# Patient Record
Sex: Female | Born: 1940 | Race: White | Hispanic: No | Marital: Married | Smoking: Former smoker
Health system: Southern US, Community
[De-identification: ages and names within clinical notes are randomized; demographics above are authoritative.]

## PROBLEM LIST (undated history)

## (undated) DIAGNOSIS — M1990 Unspecified osteoarthritis, unspecified site: Secondary | ICD-10-CM

## (undated) HISTORY — PX: JOINT REPLACEMENT: SHX530

## (undated) HISTORY — PX: OTHER SURGICAL HISTORY: SHX169

## (undated) HISTORY — PX: HERNIA REPAIR: SHX51

## (undated) HISTORY — PX: KNEE SURGERY: SHX244

---

## 2005-02-09 ENCOUNTER — Ambulatory Visit: Payer: Self-pay | Admitting: Internal Medicine

## 2005-06-08 ENCOUNTER — Ambulatory Visit: Payer: Self-pay | Admitting: Obstetrics and Gynecology

## 2005-06-14 ENCOUNTER — Inpatient Hospital Stay: Payer: Self-pay | Admitting: Obstetrics and Gynecology

## 2006-03-17 ENCOUNTER — Ambulatory Visit: Payer: Self-pay | Admitting: Internal Medicine

## 2006-09-29 IMAGING — CR DG ABDOMEN 3V
1 series · 5 of 5 positions shown · non-contrast
Comparison: none

REASON FOR EXAM: Rule out bowel obstruction
COMMENTS:  Bedside (portable):Y

[Series 1: view not recorded · 0.17mm/px · 5 of 5 slices shown]
[im 1/5]
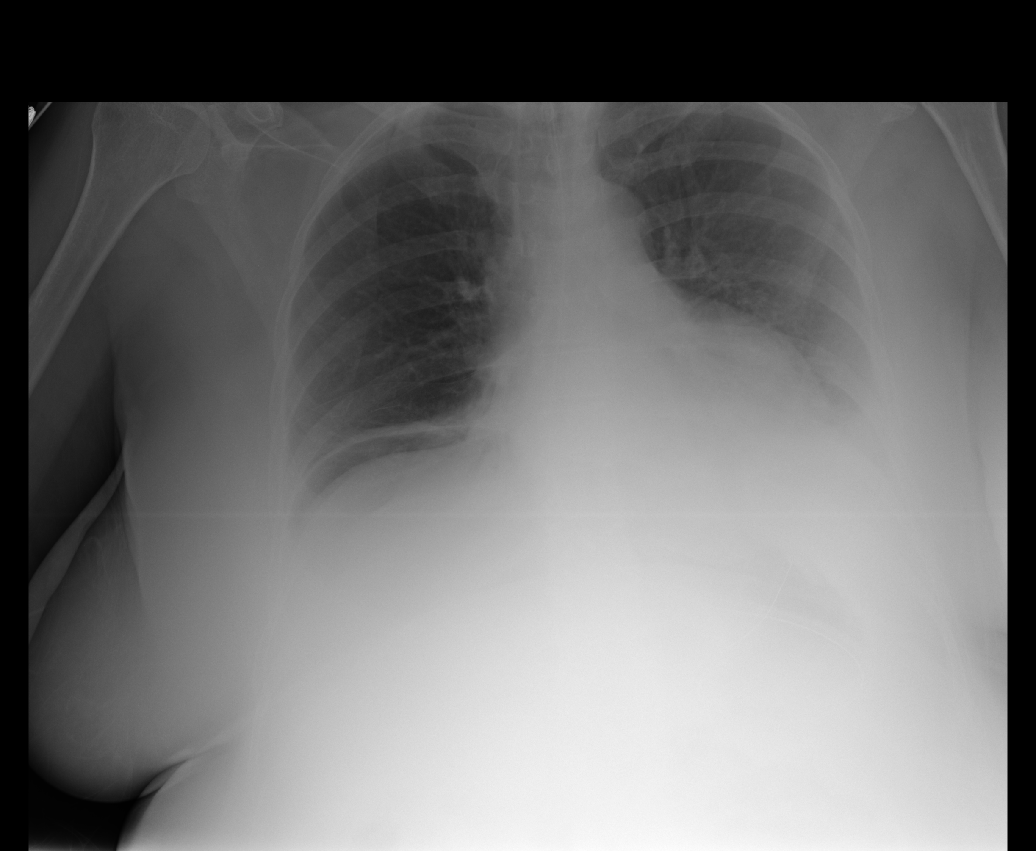
[im 2/5]
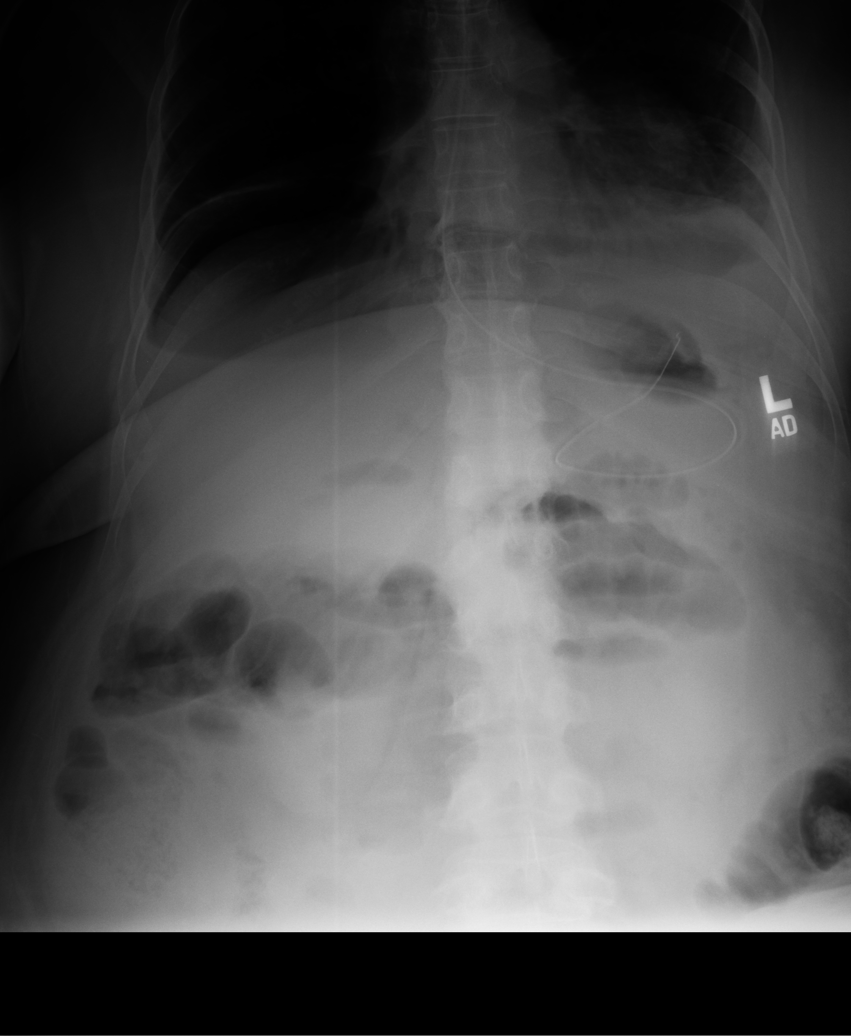
[im 3/5]
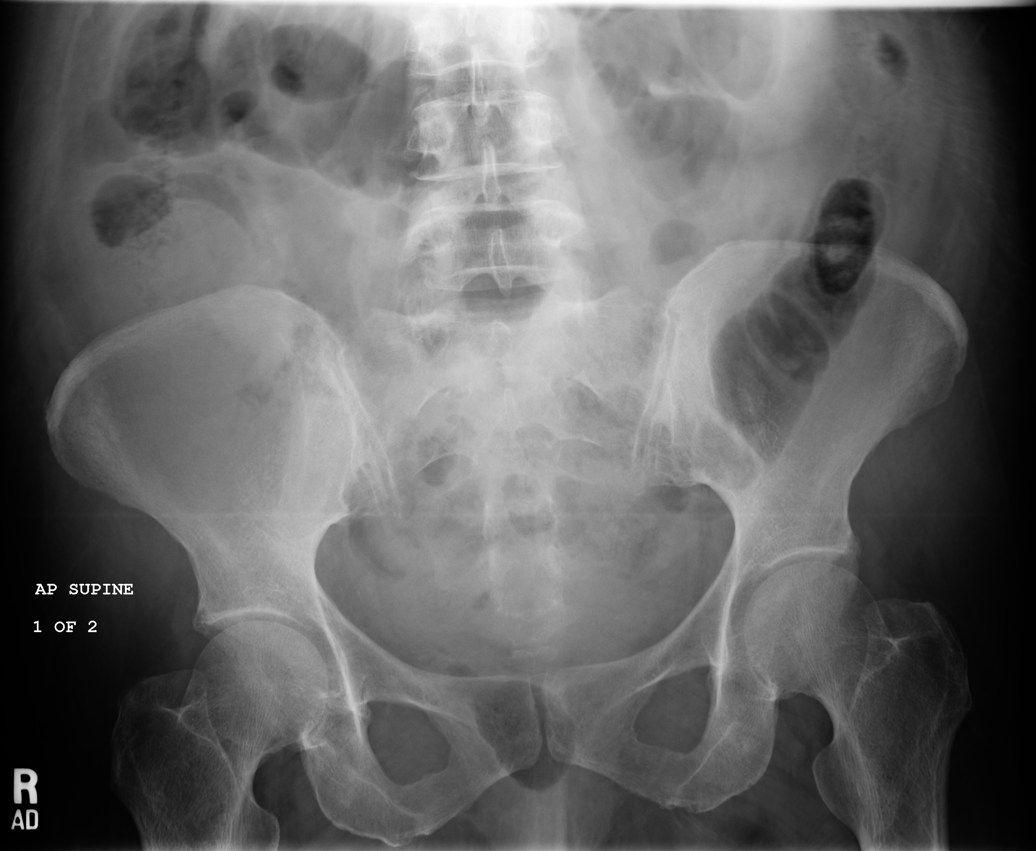
[im 4/5]
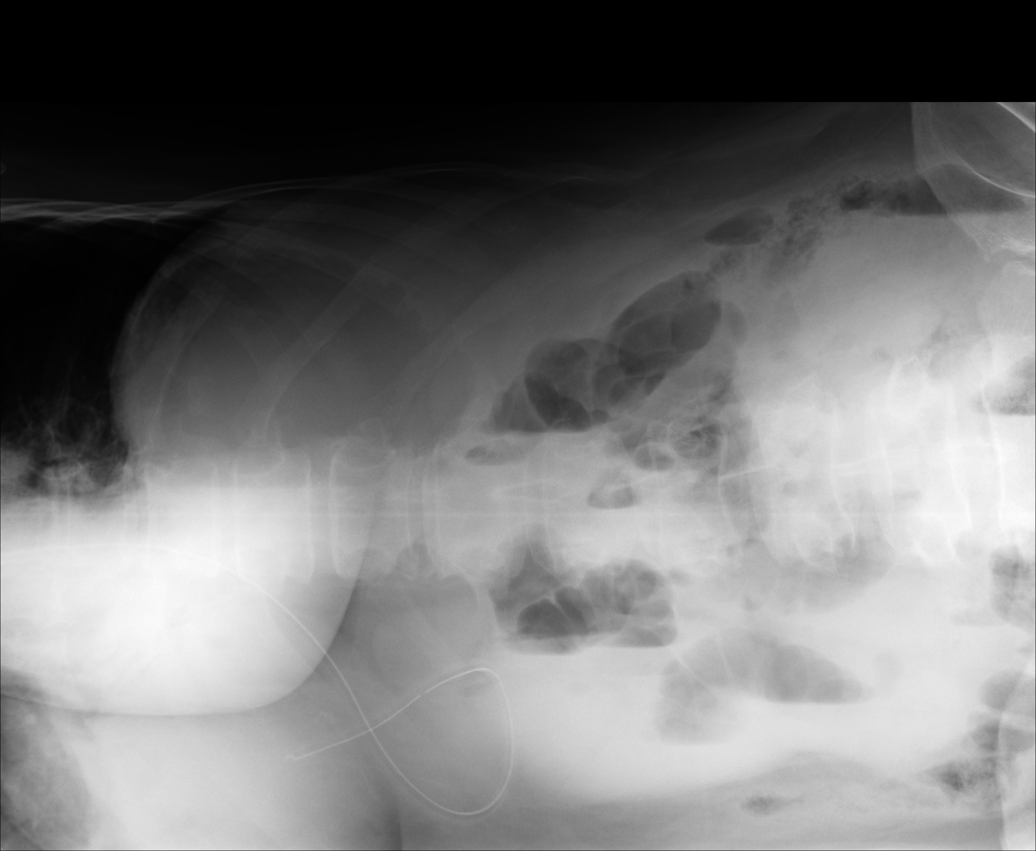
[im 5/5]
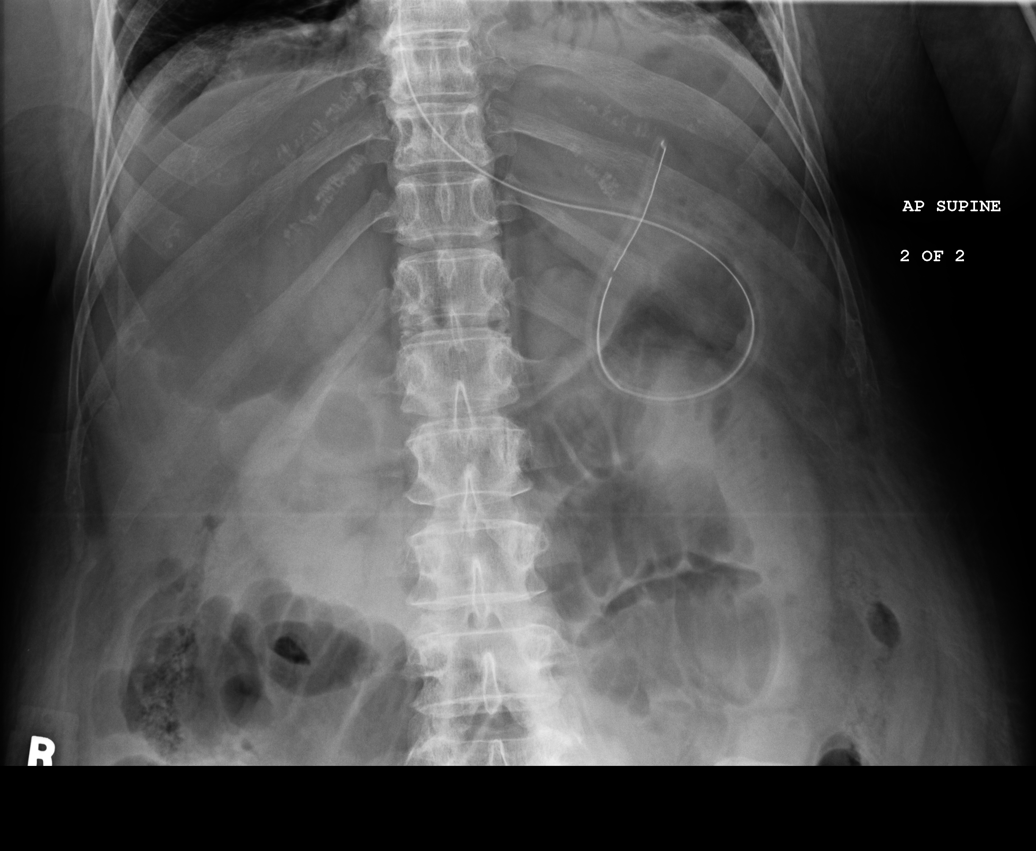

[5 of 5 positions shown; findings below may reference images not displayed]

PROCEDURE:     DXR - DXR ABDOMEN 3-WAY (INCL PA CXR)  - June 17, 2005  [DATE]

RESULT:        PA view of the chest shows subdiaphragmatic free air, similar
to that observed on the prior chest radiograph of yesterday.  The current
exam shows increased density at the LEFT base compatible with pneumonia and
atelectasis.

Multiple views of the abdomen were obtained.  In the erect view,
subdiaphragmatic air is observed.  A nasogastric tube is present with the
distal portion in the stomach.  Scattered nonspecific air-fluid levels are
noted in multiple loops of small bowel. Gas is also visualized in the colon.
 No acute bony abnormalities are seen.
IMPRESSION: 1.     Persistent subdiaphragmatic free air which on the chest radiograph
appears essentially unchanged in degree as compared to the exam of yesterday.
2.     There are nonspecific scattered air-fluid levels in multiple loops of
small bowel.  No findings of bowel obstruction are seen.
3.     There is increased density at the LEFT lung base compatible with
pneumonia and/or atelectasis.

## 2006-09-30 IMAGING — CR DG CHEST 1V PORT
1 series · 1 of 1 positions shown · non-contrast
Comparison: none

REASON FOR EXAM: Ventilator
COMMENTS:

[view not recorded]
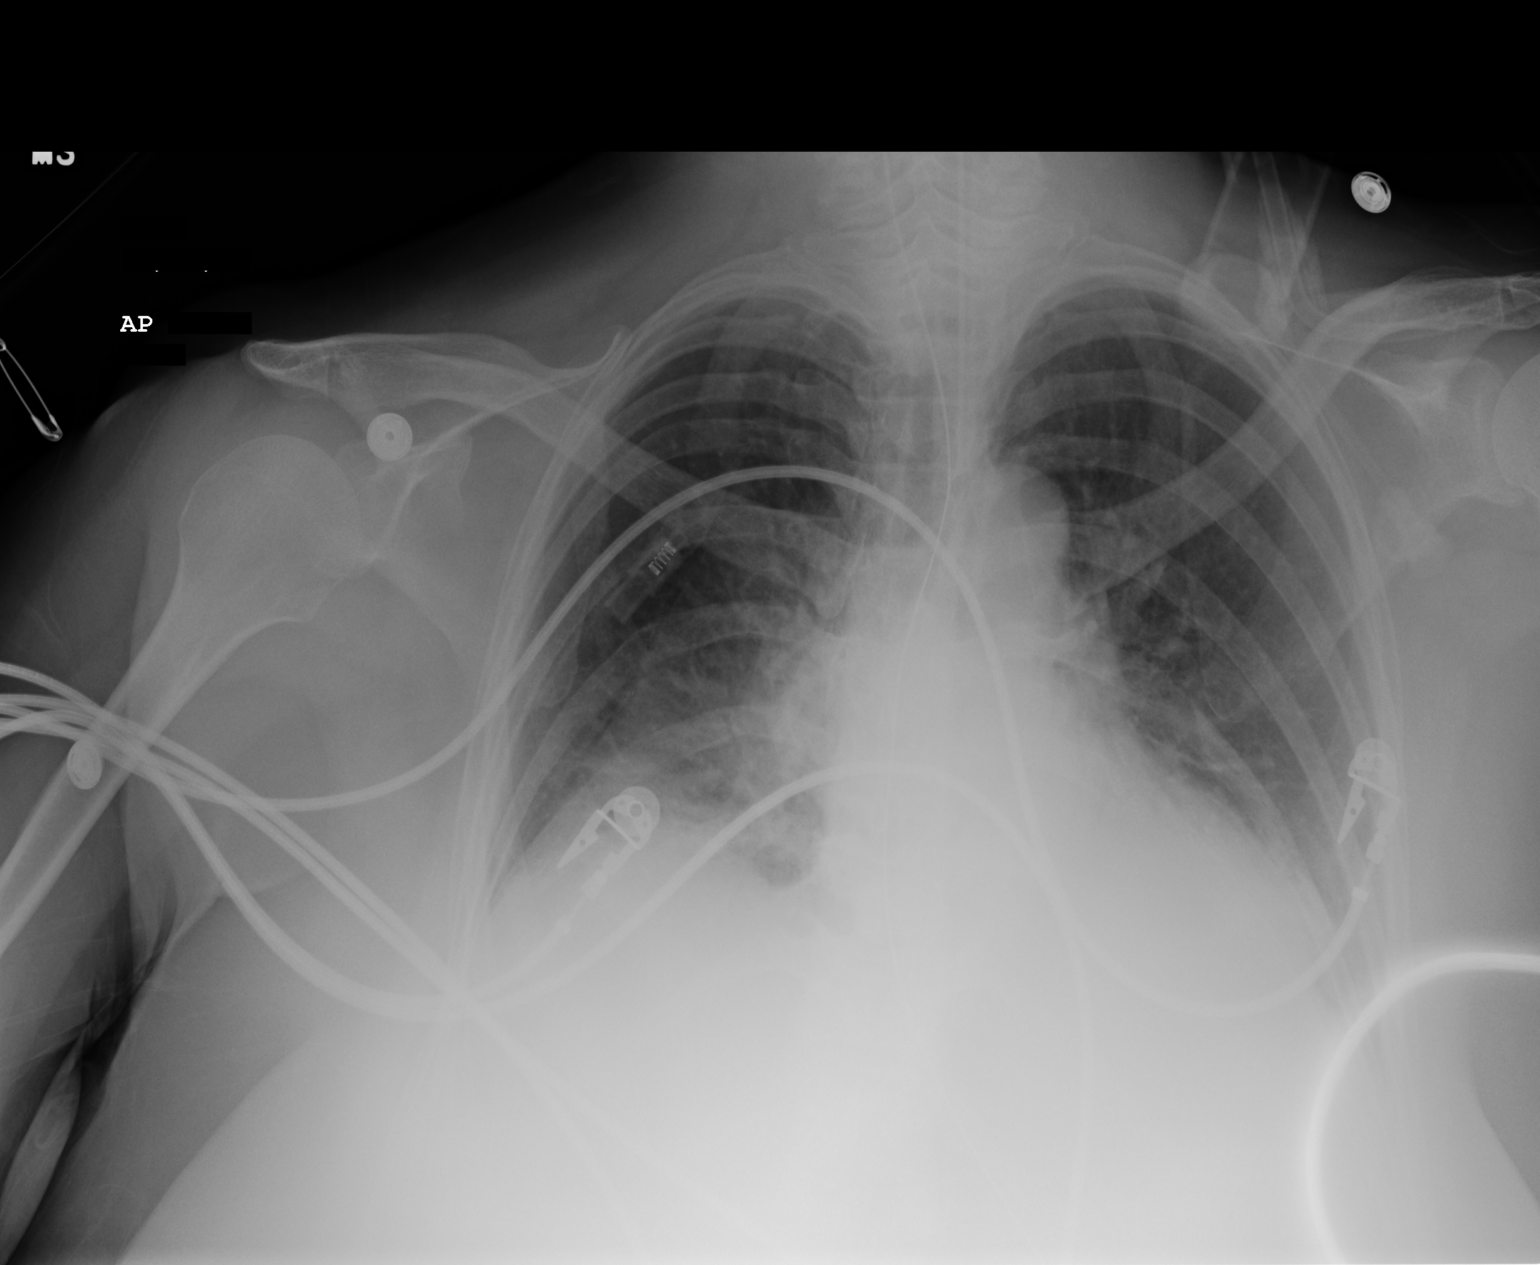

[1 of 1 positions shown; findings below may reference images not displayed]

PROCEDURE:     DXR - DXR PORTABLE CHEST SINGLE VIEW  - June 18, 2005  [DATE]

RESULT:     Comparison is made to a study [DATE].

The lungs are reasonably well inflated. The hemidiaphragms remain partially
obscured but some improvement on the RIGHT especially has occurred. The
perihilar interstitial markings remain increased and the cardiopericardial
silhouette remains enlarged. The endotracheal tube tip lies approximately
1.5 cm above the clavicular heads. Advancement by 1.0 cm may be of value.
IMPRESSION: There may have been slight interval improvement since
yesterday's study. Continued follow-up films are recommended.

## 2006-10-01 IMAGING — CR DG CHEST 1V PORT
1 series · 1 of 1 positions shown · non-contrast
Comparison: none

REASON FOR EXAM: Ventilator
COMMENTS:

[view not recorded]
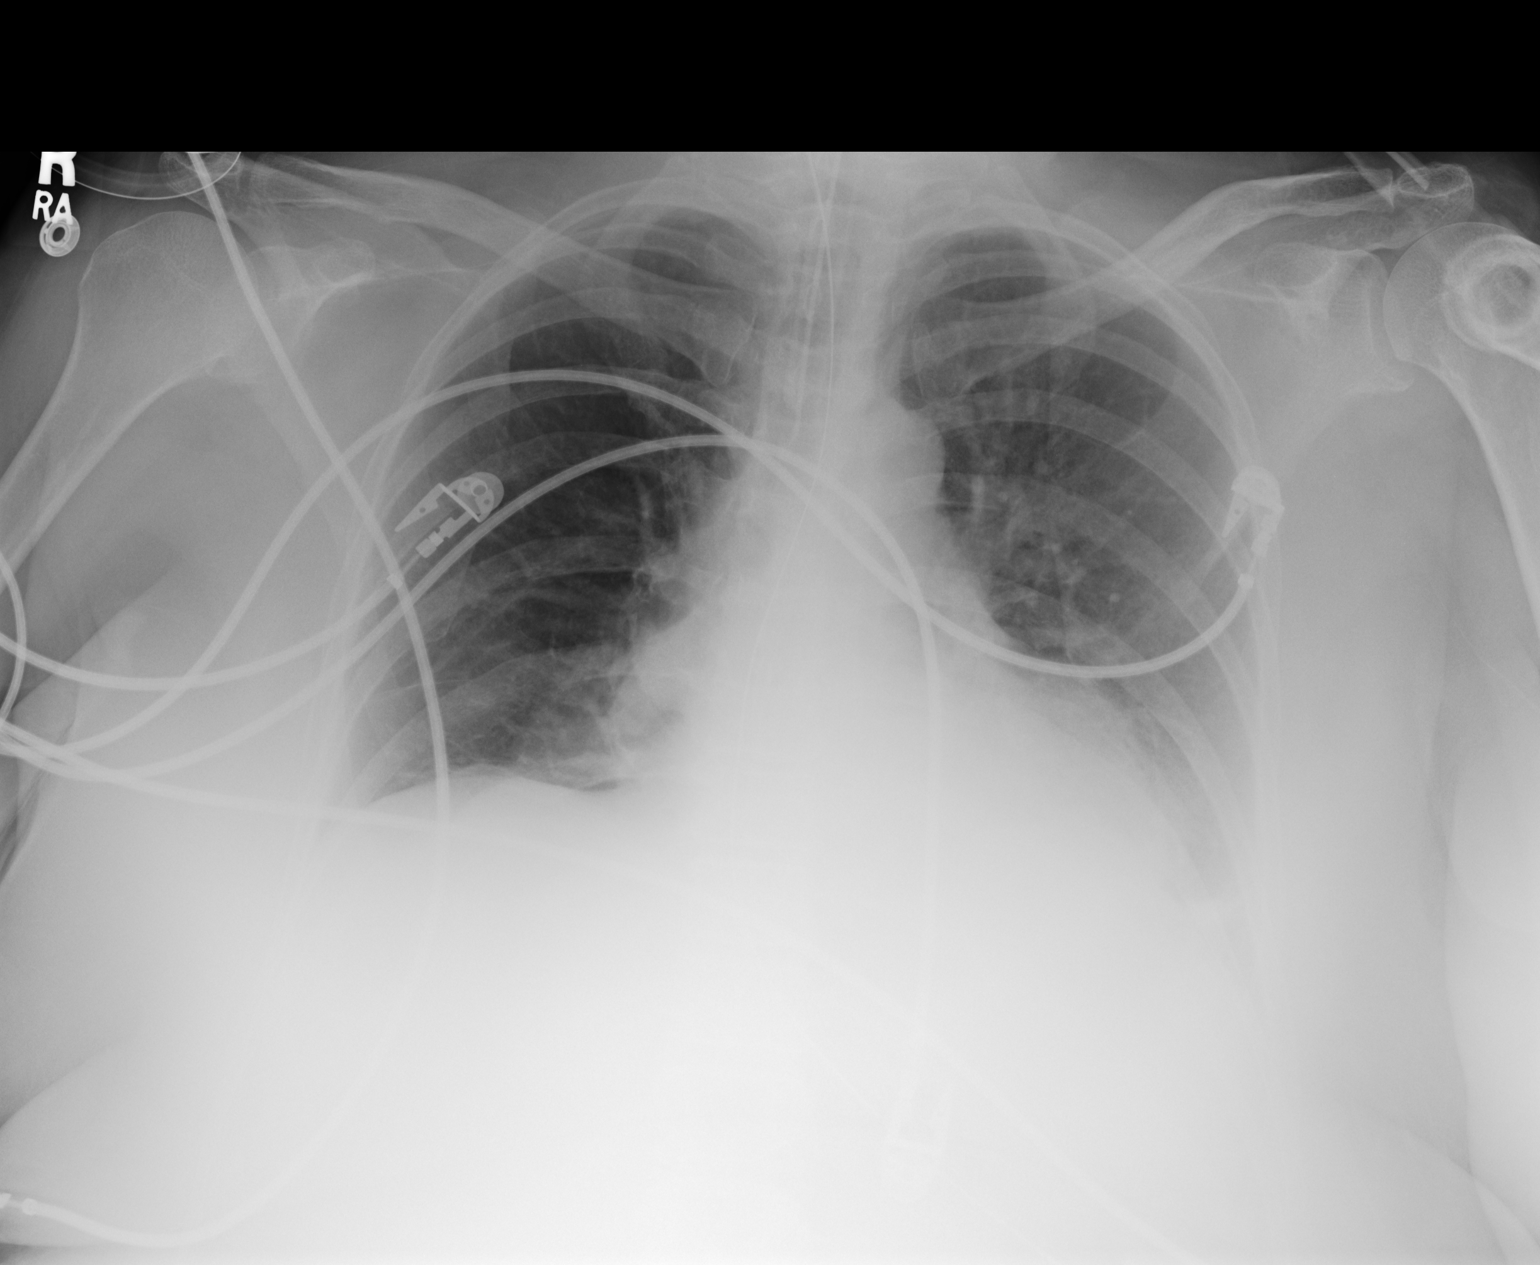

[1 of 1 positions shown; findings below may reference images not displayed]

PROCEDURE:     DXR - DXR PORTABLE CHEST SINGLE VIEW  - June 19, 2005  [DATE]

RESULT:     Comparison is made to a study of 06/18/2005.

The endotracheal tube remains in place. The tip is just at the level of the
aortic arch. An esophagogastric tube passes into the distal esophageal
region but further than that is not well seen because of the patient's size
and exposure factors. The heart is enlarged. There is some minimal basilar
atelectasis on the LEFT. The perihilar interstitial markings are more
normal.
IMPRESSION: Some residual LEFT basilar atelectasis.

## 2006-10-03 IMAGING — CR DG CHEST 1V PORT
1 series · 1 of 1 positions shown · non-contrast
Comparison: none

REASON FOR EXAM: Shortness of breath
COMMENTS:

[view not recorded]
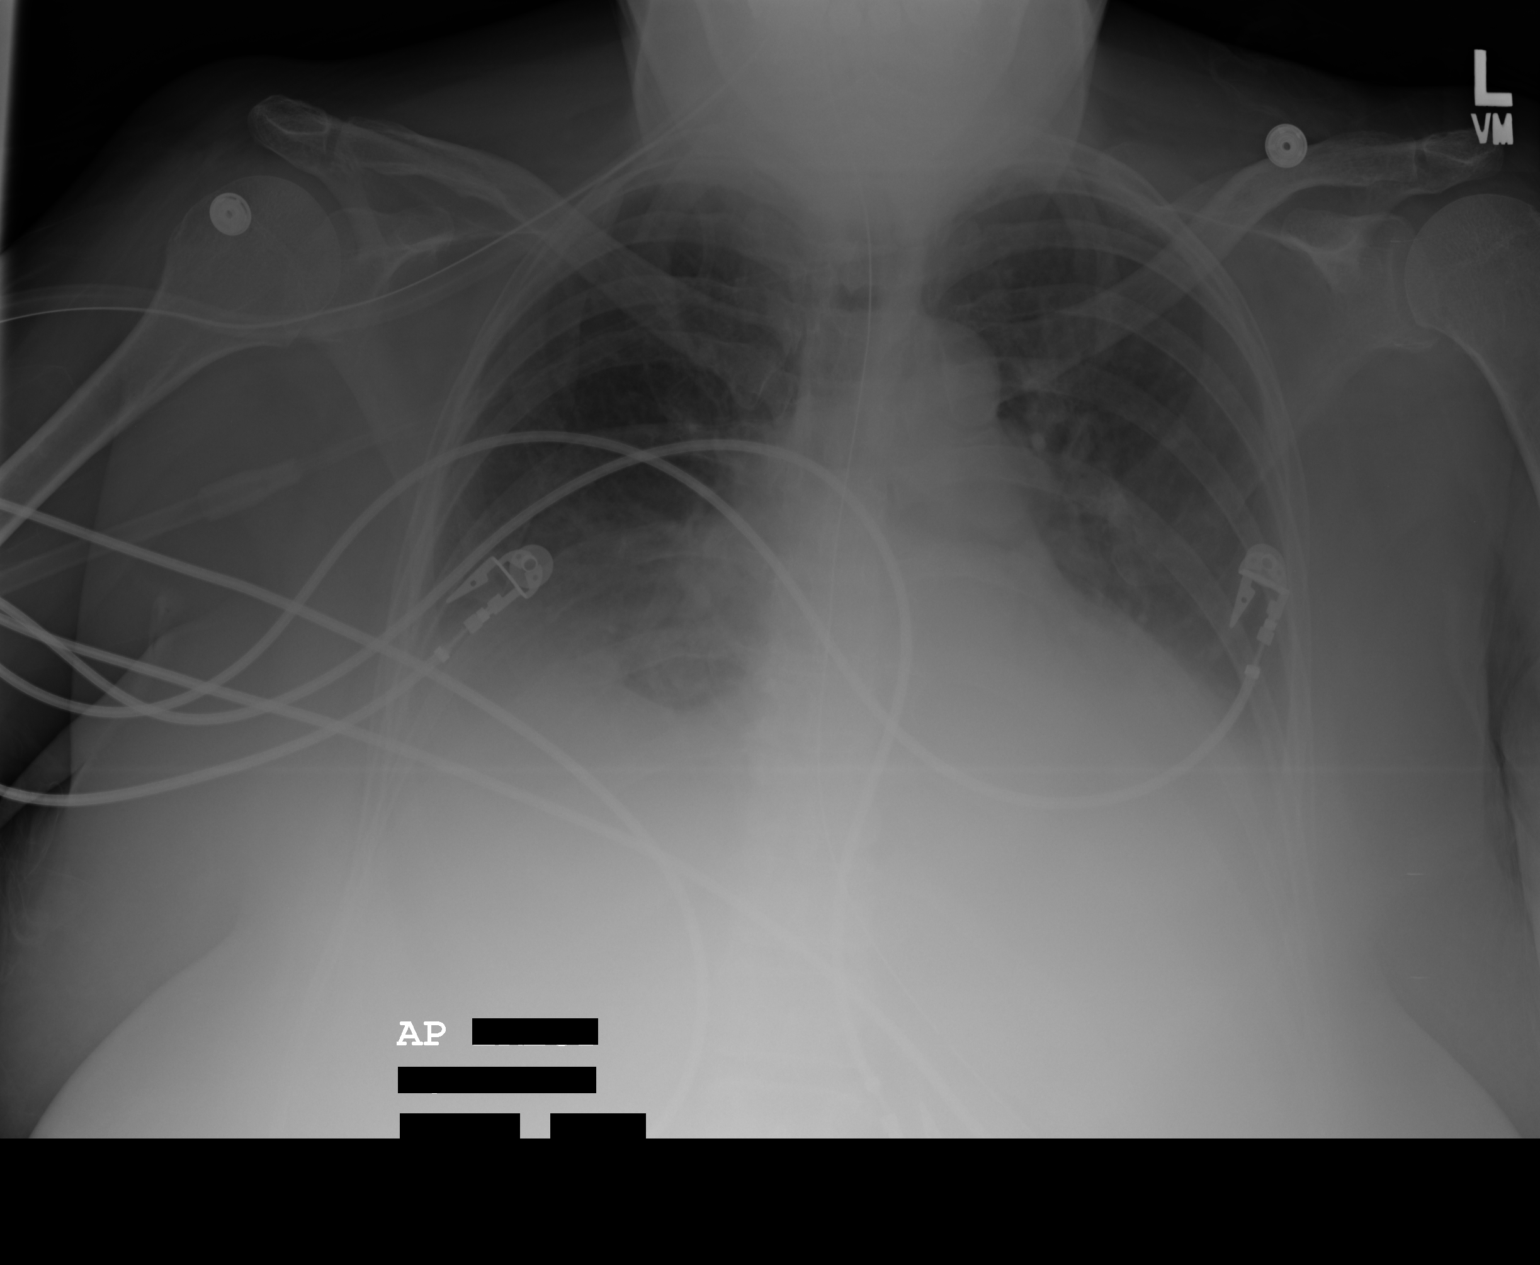

[1 of 1 positions shown; findings below may reference images not displayed]

PROCEDURE:     DXR - DXR PORTABLE CHEST SINGLE VIEW  - June 21, 2005  [DATE]

RESULT:          Comparison is made to a study of 06/19/2005.

The heart remains enlarged.  There is persistent increased density at both
lung bases which appears to be greater on the RIGHT compared to the previous
day's study but is similar to that seen on [DATE].  These may be changes of
bibasilar atelectasis.  An esophagogastric tube passes below the diaphragm.
Some mild prominence of the pulmonary vasculature is noted.
IMPRESSION: Cardiomegaly with pulmonary vascular congestion and
some areas of basilar atelectasis.  Trace effusions cannot be excluded.
Continued followup is recommended.

## 2007-05-10 ENCOUNTER — Ambulatory Visit: Payer: Self-pay | Admitting: Internal Medicine

## 2007-05-11 ENCOUNTER — Ambulatory Visit: Payer: Self-pay | Admitting: Internal Medicine

## 2007-05-25 ENCOUNTER — Ambulatory Visit: Payer: Self-pay | Admitting: Internal Medicine

## 2008-08-29 ENCOUNTER — Ambulatory Visit: Payer: Self-pay | Admitting: Internal Medicine

## 2009-08-15 ENCOUNTER — Ambulatory Visit: Payer: Self-pay | Admitting: Internal Medicine

## 2009-09-02 ENCOUNTER — Ambulatory Visit: Payer: Self-pay | Admitting: Internal Medicine

## 2009-10-22 ENCOUNTER — Ambulatory Visit: Payer: Self-pay | Admitting: Orthopedic Surgery

## 2009-10-29 ENCOUNTER — Ambulatory Visit: Payer: Self-pay | Admitting: Orthopedic Surgery

## 2010-09-26 ENCOUNTER — Ambulatory Visit: Payer: Self-pay | Admitting: Internal Medicine

## 2011-04-02 ENCOUNTER — Ambulatory Visit: Payer: Self-pay

## 2011-04-21 ENCOUNTER — Ambulatory Visit: Payer: Self-pay | Admitting: Orthopedic Surgery

## 2011-04-28 ENCOUNTER — Ambulatory Visit: Payer: Self-pay | Admitting: Orthopedic Surgery

## 2012-06-16 ENCOUNTER — Ambulatory Visit: Payer: Self-pay | Admitting: Internal Medicine

## 2012-09-06 ENCOUNTER — Ambulatory Visit: Payer: Self-pay | Admitting: Orthopedic Surgery

## 2012-09-07 LAB — PATHOLOGY REPORT

## 2012-10-26 ENCOUNTER — Encounter: Payer: Self-pay | Admitting: Orthopedic Surgery

## 2012-10-31 ENCOUNTER — Ambulatory Visit: Payer: Self-pay | Admitting: Orthopedic Surgery

## 2012-11-01 ENCOUNTER — Encounter: Payer: Self-pay | Admitting: Orthopedic Surgery

## 2012-12-01 ENCOUNTER — Encounter: Payer: Self-pay | Admitting: Orthopedic Surgery

## 2013-01-01 ENCOUNTER — Encounter: Payer: Self-pay | Admitting: Orthopedic Surgery

## 2013-01-09 ENCOUNTER — Ambulatory Visit: Payer: Self-pay | Admitting: Orthopedic Surgery

## 2013-01-09 LAB — URINALYSIS, COMPLETE
Bacteria: NONE SEEN
Bilirubin,UR: NEGATIVE
Leukocyte Esterase: NEGATIVE
Nitrite: NEGATIVE
Specific Gravity: 1.018 (ref 1.003–1.030)
Squamous Epithelial: 1
WBC UR: 2 /HPF (ref 0–5)

## 2013-01-09 LAB — BASIC METABOLIC PANEL
Anion Gap: 6 — ABNORMAL LOW (ref 7–16)
Calcium, Total: 9.3 mg/dL (ref 8.5–10.1)
Co2: 29 mmol/L (ref 21–32)
Creatinine: 0.39 mg/dL — ABNORMAL LOW (ref 0.60–1.30)
EGFR (African American): >60
EGFR (Non-African Amer.): >60
Glucose: 148 mg/dL — ABNORMAL HIGH (ref 65–99)
Potassium: 4 mmol/L (ref 3.5–5.1)
Sodium: 141 mmol/L (ref 136–145)

## 2013-01-09 LAB — CBC
HCT: 38.3 % (ref 35.0–47.0)
HGB: 13.2 g/dL (ref 12.0–16.0)
MCHC: 34.5 g/dL (ref 32.0–36.0)
MCV: 86 fL (ref 80–100)
Platelet: 197 10*3/uL (ref 150–440)
RBC: 4.44 10*6/uL (ref 3.80–5.20)

## 2013-01-09 LAB — MRSA PCR SCREENING

## 2013-01-09 LAB — PROTIME-INR
INR: 1.1
Prothrombin Time: 14.2 secs (ref 11.5–14.7)

## 2013-01-17 ENCOUNTER — Inpatient Hospital Stay: Payer: Self-pay | Admitting: Orthopedic Surgery

## 2013-01-18 LAB — HEMOGLOBIN: HGB: 12.8 g/dL (ref 12.0–16.0)

## 2013-01-18 LAB — BASIC METABOLIC PANEL
BUN: 6 mg/dL — ABNORMAL LOW (ref 7–18)
Calcium, Total: 8.8 mg/dL (ref 8.5–10.1)
Chloride: 100 mmol/L (ref 98–107)
Creatinine: 0.54 mg/dL — ABNORMAL LOW (ref 0.60–1.30)
EGFR (Non-African Amer.): >60
Potassium: 3.5 mmol/L (ref 3.5–5.1)

## 2013-01-20 LAB — PATHOLOGY REPORT

## 2013-06-20 ENCOUNTER — Ambulatory Visit: Payer: Self-pay | Admitting: Internal Medicine

## 2013-11-01 ENCOUNTER — Ambulatory Visit (INDEPENDENT_AMBULATORY_CARE_PROVIDER_SITE_OTHER): Payer: Medicare Other | Admitting: Podiatry

## 2013-11-01 ENCOUNTER — Encounter: Payer: Self-pay | Admitting: Podiatry

## 2013-11-01 VITALS — BP 151/92 | HR 71 | Resp 18 | Ht 64.0 in | Wt 204.0 lb

## 2013-11-01 DIAGNOSIS — B351 Tinea unguium: Secondary | ICD-10-CM

## 2013-11-01 MED ORDER — EFINACONAZOLE 10 % EX SOLN: 1.0000 [drp] | Freq: Every day | CUTANEOUS | Status: DC

## 2013-11-01 NOTE — Progress Notes (Signed)
The little toe left foot, both #2 toenails and the fifth toes as well.  Objective: I have reviewed her past medical history medications allergies surgeries and social history. Pulses are palpable bilateral. Evaluation of the second nail plates demonstrates onychodystrophy. This does do demonstrate onychodystrophy as well as well as some tinea pedis on the toe adjacent to the nail plate. This is indicative of onychomycosis and tinea pedis.  Assessment: Nail dystrophy second bilateral nail dystrophy fifth bilateral with onychomycosis fifth bilateral.  Plan: I encouraged routine nail debridement and thinning of the nail. I also wrote a prescription for one of the new topical drugs. I will followup with her on an as-needed basis.

## 2013-12-19 DIAGNOSIS — L719 Rosacea, unspecified: Secondary | ICD-10-CM | POA: Insufficient documentation

## 2014-05-01 IMAGING — CR DG KNEE 1-2V*R*
1 series · 2 of 2 positions shown · non-contrast
Comparison: none

REASON FOR EXAM: post partial knee replacement
COMMENTS:   LMP: Post-Menopausal

PROCEDURE:     DXR - DXR KNEE RIGHT AP AND LATERAL  - January 17, 2013  [DATE]
RESULT:

[Series 1: ap · 0.17mm/px · 2 of 2 slices shown]
[im 1/2]
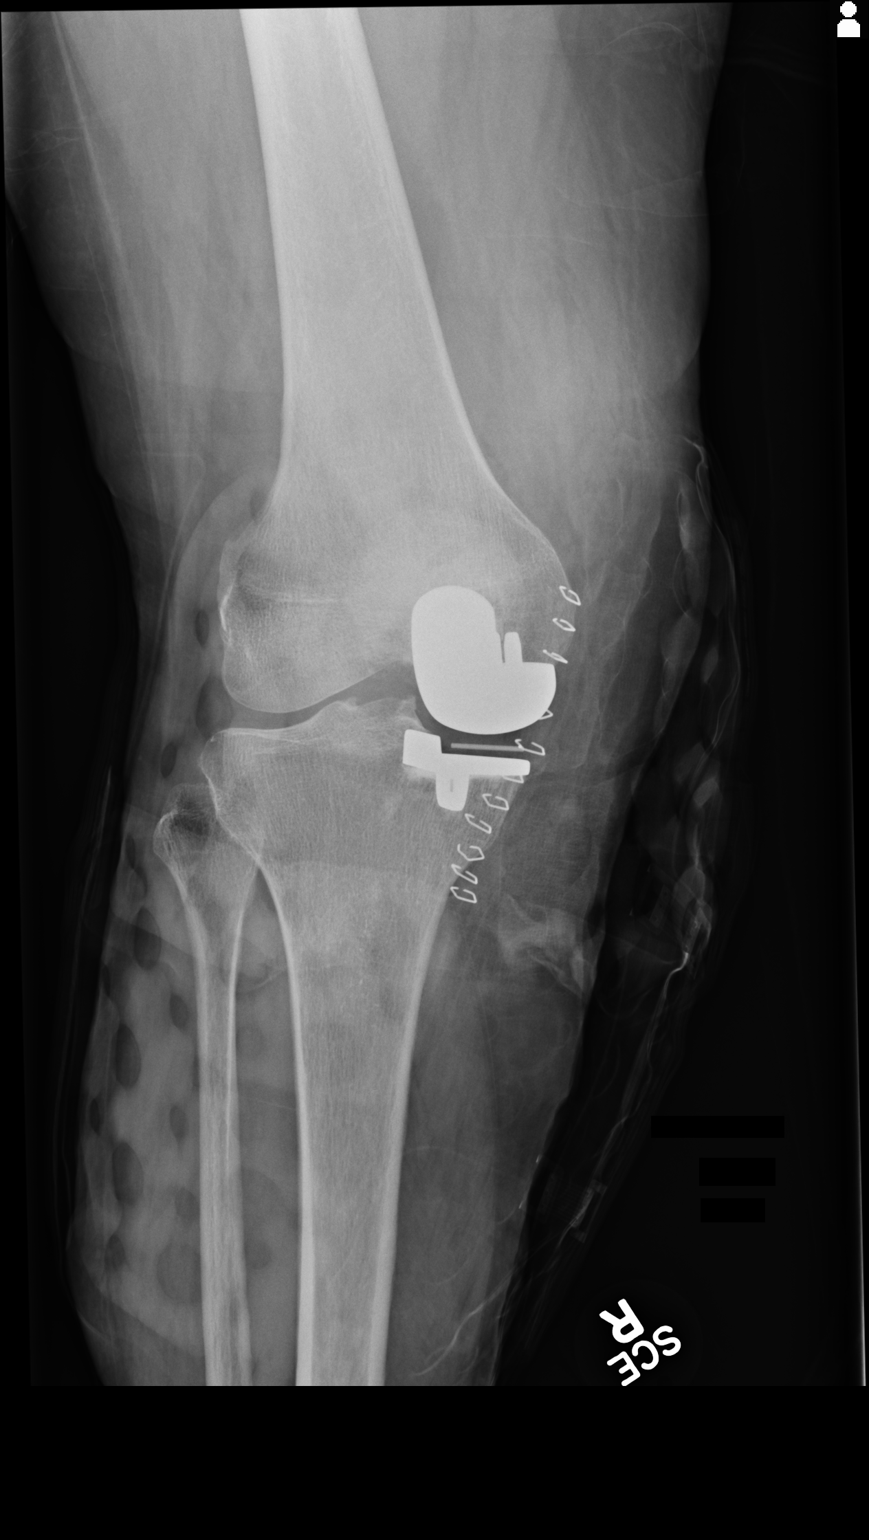
[im 2/2]
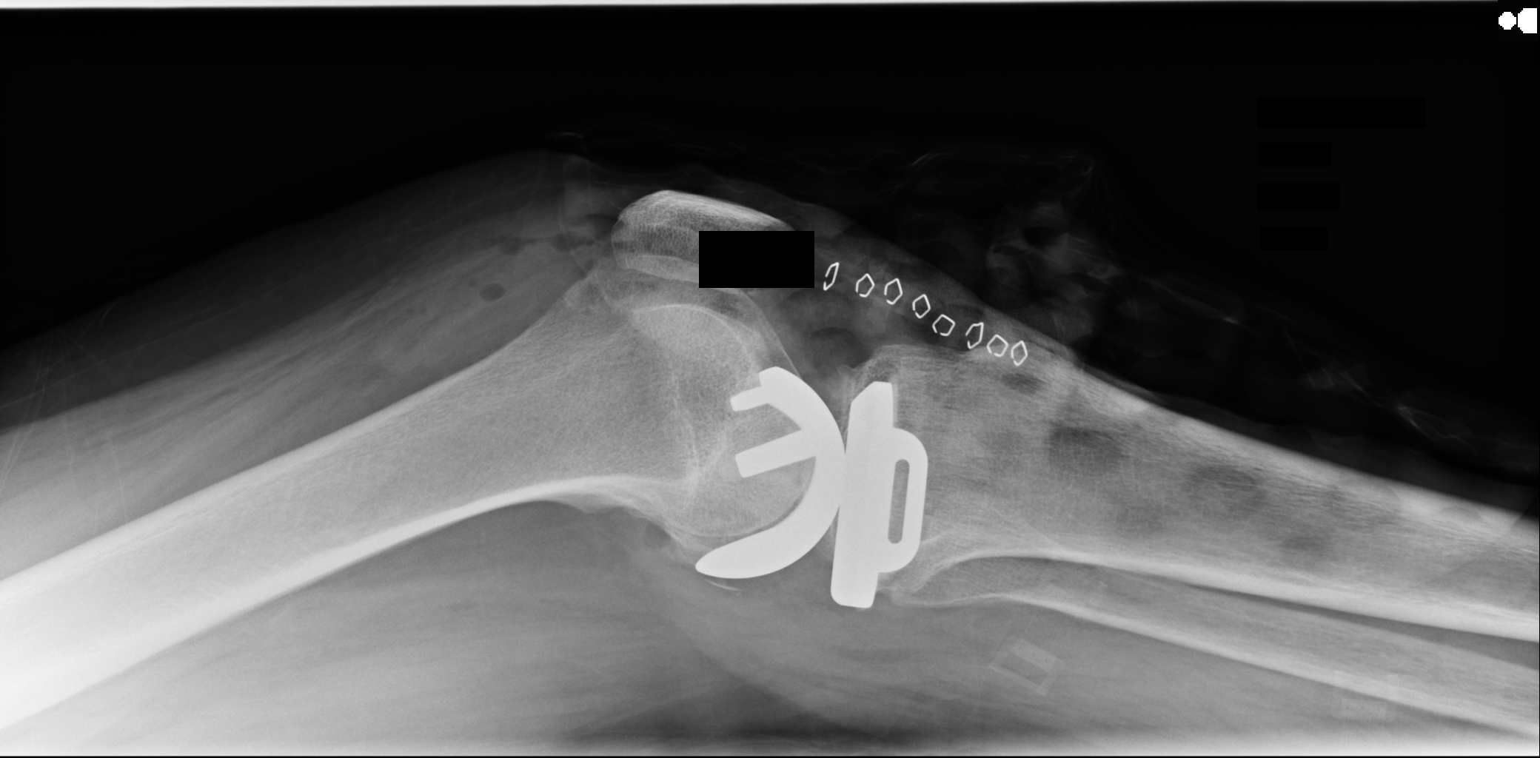

[2 of 2 positions shown; findings below may reference images not displayed]

FINDINGS: The patient is status post right knee hemiarthroplasty. Hardware
appears intact without evidence of loosening nor failure. The native osseous
structures are unremarkable. Skin staples are identified.
IMPRESSION: The patient is status post right knee hemiarthroplasty. The
remainder of the interpretation will be left to the performing physician.

## 2014-07-11 ENCOUNTER — Ambulatory Visit: Payer: Self-pay | Admitting: Obstetrics & Gynecology

## 2014-07-19 ENCOUNTER — Ambulatory Visit: Payer: Self-pay | Admitting: Gastroenterology

## 2014-07-20 ENCOUNTER — Ambulatory Visit: Payer: Self-pay | Admitting: Internal Medicine

## 2014-09-01 ENCOUNTER — Ambulatory Visit: Payer: Self-pay | Admitting: Family Medicine

## 2014-11-09 ENCOUNTER — Ambulatory Visit
Admit: 2014-11-09 | Disposition: A | Discharge status: Home / Self Care | Payer: Self-pay | Attending: Orthopedic Surgery | Admitting: Orthopedic Surgery

## 2014-11-23 NOTE — Op Note (Signed)
PATIENT NAME:  Jamie Horn, Jamie Horn MR#:  [id number] DATE OF BIRTH:  1941/05/30  DATE OF PROCEDURE:  01/17/2013  PREOPERATIVE DIAGNOSIS:  Right medial compartment osteoarthritis.   POSTOPERATIVE DIAGNOSIS:  Right medial compartment osteoarthritis.  PROCEDURE:  Right knee Oxford unicompartmental arthroplasty, medial compartment.   ANESTHESIA:  Spinal.  SURGEON:  Laurene Footman, M.D.   ASSISTANT:  Rachelle Hora, PA-C.   DESCRIPTION OF PROCEDURE:  The patient was brought to the operating room and after adequate anesthesia was obtained, the right leg was prepped and draped in the usual sterile fashion with the leg in the legholder to allow for flexion with a tourniquet to the upper thigh.  The left patient was placed on the table with slight flexion to the bed to allow for comfortable position for the nonoperative leg.  After prepping and draping the right leg, the appropriate patient identification and timeout procedures were completed.  The tourniquet was raised to 300 mmHg after exsanguinating the limb and a medial arthrotomy carried out.  Skin incision was carried down to the capsule with hemostasis being achieved by electrocautery.  A medial capsulotomy then was carried out and inspection revealed normal-appearing patellofemoral and lateral compartment with intact ACL.  The medial compartment had extensive degenerative change in the weight-bearing surface on both tibial and femoral sides.  The anterior horns of the medial meniscus was removed at this time and the Biomet signature femoral drill guide was placed and it captured the bone nicely and the two drill holes were made for subsequent medium cutting guide.  After these drill holes were made the medium cutting guide was applied and the posterior cut made and this cut matched the template.  Zero milling was carried out at this time using the zero spigot with the milling device.  Next, the tibial alignment guide for the signature system was then applied  with drill holes made, pegs were inserted and using these pegs a vertical cut was made followed by the horizontal cut using the extramedullary guide.  The tibia cut appeared appropriate and the residual meniscus was removed at this point.  The tibia was sized to a B, the tibial trial placed and the toothbrush saw used to make a groove in the proximal tibia.  The tibial trial was inserted and there was good coverage.  It did not appear to have soft tissue impingement.  With the femoral trial placed, feeler gauges were checked and 3 additional millimeters were removed using the 3 spigot on the distal femur.  The anterior chamfer reamer guide was used along with the device to remove posterior osteophyte.  The knee was thoroughly irrigated at this point and final trial components were placed with the 4 or 5 meniscal bearing surface to be determined with final implants.  The knee was then infiltrated with Exparel around the joint to aid in postop analgesia.  The knee was thoroughly irrigated and dried at this point.  The tibial component cemented into place first with excess cement removed, followed by the femoral component and the feeler gauge with the knee in slight flexion.  After allowing this to set and the excess cement being removed, the final trials were placed and the 4 was a little loose and so the 5 was chosen.  It popped into place nicely.  The 5 meniscus bearing surface was then inserted and the knee was stable through range of motion.  There was no impingement anteriorly.  The arthrotomy was then repaired after again irrigation of  the joint with a heavy quill suture, 2-0 quill subcutaneously and skin staples.  Additional local anesthetic was infiltrated subcutaneously at this point with 0.5% Sensorcaine being placed.  The dressing was applied with Xeroform, 4 x 4, Webril, and Ace wrap and the patient was sent to the recovery room in stable condition.   ESTIMATED BLOOD LOSS:  50 mL.   COMPLICATIONS:   None.   SPECIMEN:  None.   TOURNIQUET TIME:  66 minutes at 300 mmHg.     ____________________________ Laurene Footman, MD mjm:ea D: 01/17/2013 22:31:16 ET T: 01/17/2013 23:37:58 ET JOB#: [id number]  cc: Laurene Footman, MD, <Dictator> Laurene Footman MD ELECTRONICALLY SIGNED 01/18/2013 8:25

## 2014-11-23 NOTE — Op Note (Signed)
PATIENT NAME:  KELENA, GARROW MR#:  [id number] DATE OF BIRTH:  [birth year]  DATE OF PROCEDURE:  09/06/2012  PREOPERATIVE DIAGNOSIS: Left wrist dorsal ganglion.   POSTOPERATIVE DIAGNOSIS: Extensor tenosynovitis, left wrist.  PROCEDURE: Extensor tenosynovectomy, left wrist.   SURGEON: Hessie Knows, M.D.   ANESTHESIA: General.    DESCRIPTION OF PROCEDURE: The patient was brought to the operating room and after adequate anesthesia was obtained, the left arm was prepped and draped in the usual sterile fashion. A transverse incision was made over the area of the mass; approximately 2.5 cm incision. The subcutaneous tissue was spread and exploration of the extensor tendons. There was a marked tenosynovitis present. After dissecting more proximally and releasing the extensor retinaculum, it is apparent that the mass was made up of this extensor tenosynovitis. This was debrided from the common extensors. This appeared to be the area of the mass. After adequate debridement, there was no further palpable mass. There was some dense fatty tissue adjacent to this and this was also sent as a specimen along with the tenosynovium. The wound was thoroughly irrigated and closed with a simple interrupted 4-0 nylon skin suture. Sensorcaine 5 mL 0.5% was infiltrated for postoperative analgesia. The patient tolerated the procedure well and was sent to the recovery room in stable condition.   ESTIMATED BLOOD LOSS: Minimal.   COMPLICATIONS: None.   SPECIMEN: Tenosynovium and subcutaneous fatty tissue.   TOURNIQUET TIME: 22 minutes at 250 mmHg.   ____________________________ Laurene Footman, MD mjm:aw D: 09/06/2012 21:58:28 ET T: 09/07/2012 07:31:33 ET JOB#: [id number]  cc: Laurene Footman, MD, <Dictator> Laurene Footman MD ELECTRONICALLY SIGNED 09/07/2012 8:07

## 2015-01-24 DIAGNOSIS — M1710 Unilateral primary osteoarthritis, unspecified knee: Secondary | ICD-10-CM | POA: Insufficient documentation

## 2015-02-20 DIAGNOSIS — E669 Obesity, unspecified: Secondary | ICD-10-CM | POA: Insufficient documentation

## 2015-06-20 ENCOUNTER — Other Ambulatory Visit: Payer: Self-pay | Admitting: Internal Medicine

## 2015-06-20 DIAGNOSIS — Z1231 Encounter for screening mammogram for malignant neoplasm of breast: Secondary | ICD-10-CM

## 2015-07-15 ENCOUNTER — Ambulatory Visit: Payer: Self-pay

## 2015-07-17 ENCOUNTER — Ambulatory Visit
Admission: RE | Admit: 2015-07-17 | Discharge: 2015-07-17 | Disposition: A | Discharge status: Home / Self Care | Payer: Medicare Other | Source: Ambulatory Visit | Attending: Internal Medicine | Admitting: Internal Medicine

## 2015-07-17 DIAGNOSIS — Z1231 Encounter for screening mammogram for malignant neoplasm of breast: Secondary | ICD-10-CM | POA: Insufficient documentation

## 2015-08-21 DIAGNOSIS — Z96652 Presence of left artificial knee joint: Secondary | ICD-10-CM | POA: Insufficient documentation

## 2016-07-13 ENCOUNTER — Other Ambulatory Visit: Payer: Self-pay | Admitting: Internal Medicine

## 2016-07-13 DIAGNOSIS — Z1231 Encounter for screening mammogram for malignant neoplasm of breast: Secondary | ICD-10-CM

## 2016-08-17 ENCOUNTER — Ambulatory Visit
Admission: RE | Admit: 2016-08-17 | Discharge: 2016-08-17 | Disposition: A | Discharge status: Home / Self Care | Payer: Medicare Other | Source: Ambulatory Visit | Attending: Internal Medicine | Admitting: Internal Medicine

## 2016-08-17 DIAGNOSIS — Z1231 Encounter for screening mammogram for malignant neoplasm of breast: Secondary | ICD-10-CM | POA: Diagnosis not present

## 2017-08-26 ENCOUNTER — Other Ambulatory Visit: Payer: Self-pay | Admitting: Internal Medicine

## 2017-08-26 DIAGNOSIS — R739 Hyperglycemia, unspecified: Secondary | ICD-10-CM | POA: Insufficient documentation

## 2017-08-26 DIAGNOSIS — R7989 Other specified abnormal findings of blood chemistry: Secondary | ICD-10-CM | POA: Insufficient documentation

## 2017-08-26 DIAGNOSIS — Z1231 Encounter for screening mammogram for malignant neoplasm of breast: Secondary | ICD-10-CM

## 2017-08-26 DIAGNOSIS — E786 Lipoprotein deficiency: Secondary | ICD-10-CM | POA: Insufficient documentation

## 2017-09-14 ENCOUNTER — Ambulatory Visit
Admission: RE | Admit: 2017-09-14 | Discharge: 2017-09-14 | Disposition: A | Discharge status: Home / Self Care | Payer: Medicare Other | Source: Ambulatory Visit | Attending: Internal Medicine | Admitting: Internal Medicine

## 2017-09-14 DIAGNOSIS — Z1231 Encounter for screening mammogram for malignant neoplasm of breast: Secondary | ICD-10-CM | POA: Diagnosis not present

## 2017-11-05 ENCOUNTER — Ambulatory Visit: Payer: BC Managed Care – PPO | Admitting: Podiatry

## 2017-11-05 ENCOUNTER — Encounter: Payer: Self-pay | Admitting: Podiatry

## 2017-11-05 DIAGNOSIS — L600 Ingrowing nail: Secondary | ICD-10-CM | POA: Diagnosis not present

## 2017-11-05 DIAGNOSIS — M79676 Pain in unspecified toe(s): Secondary | ICD-10-CM | POA: Diagnosis not present

## 2017-11-05 DIAGNOSIS — B351 Tinea unguium: Secondary | ICD-10-CM

## 2017-11-05 DIAGNOSIS — M79609 Pain in unspecified limb: Secondary | ICD-10-CM

## 2017-11-10 NOTE — Progress Notes (Signed)
   Subjective: Patient presents today for evaluation of pain to the lateral border of the right great toe that began 2-3 days ago. She reports associated redness of the area. Patient is concerned for possible ingrown nail. Applying pressure to the area increases the pain. She has been applying peroxide to the toe for treatment. Patient presents today for further treatment and evaluation.  No past medical history on file.  Objective:  General: Well developed, nourished, in no acute distress, alert and oriented x3   Dermatology: Skin is warm, dry and supple bilateral. Lateral border of the right great toe appears to be erythematous with evidence of an ingrowing nail. Pain on palpation noted to the border of the nail fold. The remaining nails appear unremarkable at this time. There are no open sores, lesions.  Vascular: Dorsalis Pedis artery and Posterior Tibial artery pedal pulses palpable. No lower extremity edema noted.   Neruologic: Grossly intact via light touch bilateral.  Musculoskeletal: Muscular strength within normal limits in all groups bilateral. Normal range of motion noted to all pedal and ankle joints.   Assesement: #1 Paronychia with ingrowing nail lateral border right great toe #2 Pain in toe #3 Incurvated nail  Plan of Care:  1. Patient evaluated.  2. Discussed treatment alternatives and plan of care. Explained nail avulsion procedure and post procedure course to patient. 3. Patient opted for conservative treatment at this time.  4. Mechanical debridement of the right great toenail performed using a nail nipper. Filed with dremel without incident.  5. Return to clinic as needed. If it recurs, we will perform a nail avulsion procedure.   Edrick Kins, DPM [facility]  Dr. Edrick Kins, [address]                                        [place], [place] [address]                Office [phone]  Fax [phone]

## 2017-12-14 ENCOUNTER — Ambulatory Visit: Payer: Medicare Other | Admitting: Podiatry

## 2017-12-14 DIAGNOSIS — L600 Ingrowing nail: Secondary | ICD-10-CM | POA: Diagnosis not present

## 2017-12-17 NOTE — Progress Notes (Signed)
   Subjective: Patient presents today for evaluation of pain to the lateral border of the right hallux that began 3-4 weeks ago. She reports associated swelling of the area. Patient is concerned for possible ingrown nail. She has been applying antibiotic ointment and clipping the nail for treatment. Patient presents today for further treatment and evaluation.  No past medical history on file.  Objective:  General: Well developed, nourished, in no acute distress, alert and oriented x3   Dermatology: Skin is warm, dry and supple bilateral. Lateral border of the right hallux appears to be erythematous with evidence of an ingrowing nail. Pain on palpation noted to the border of the nail fold. The remaining nails appear unremarkable at this time. There are no open sores, lesions.  Vascular: Dorsalis Pedis artery and Posterior Tibial artery pedal pulses palpable. No lower extremity edema noted.   Neruologic: Grossly intact via light touch bilateral.  Musculoskeletal: Muscular strength within normal limits in all groups bilateral. Normal range of motion noted to all pedal and ankle joints.   Assesement: #1 Paronychia with ingrowing nail lateral border of the right hallux  #2 Pain in toe #3 Incurvated nail  Plan of Care:  1. Patient evaluated.  2. Discussed treatment alternatives and plan of care. Explained nail avulsion procedure and post procedure course to patient. 3. Patient opted for permanent partial nail avulsion.  4. Prior to procedure, local anesthesia infiltration utilized using 3 ml of a 50:50 mixture of 2% plain lidocaine and 0.5% plain marcaine in a normal hallux block fashion and a betadine prep performed.  5. Partial permanent nail avulsion with chemical matrixectomy performed using [id number] applications of phenol followed by alcohol flush.  6. Light dressing applied. 7. Return to clinic in 2 weeks.   Edrick Kins, DPM [facility]  Dr. Edrick Kins, [address]                                        [place], [place] [address]                Office [phone]  Fax [phone]

## 2017-12-31 ENCOUNTER — Ambulatory Visit: Payer: Medicare Other | Admitting: Podiatry

## 2017-12-31 ENCOUNTER — Encounter: Payer: Self-pay | Admitting: Podiatry

## 2017-12-31 DIAGNOSIS — L600 Ingrowing nail: Secondary | ICD-10-CM | POA: Diagnosis not present

## 2018-01-03 NOTE — Progress Notes (Signed)
   Subjective: Patient presents today 2 weeks post ingrown nail permanent nail avulsion procedure of the lateral border of the right hallux. Patient states that the toe and nail fold is feeling much better. She reports some mild drainage and soreness. Patient is here for further evaluation and treatment.   No past medical history on file.  Objective: Skin is warm, dry and supple. Nail and respective nail fold appears to be healing appropriately. Open wound to the associated nail fold with a granular wound base and moderate amount of fibrotic tissue. Minimal drainage noted. Mild erythema around the periungual region likely due to phenol chemical matricectomy.  Assessment: #1 postop permanent partial nail avulsion lateral border right hallux  #2 open wound periungual nail fold of respective digit.   Plan of care: #1 patient was evaluated  #2 debridement of open wound was performed to the periungual border of the respective toe using a currette. Antibiotic ointment and Band-Aid was applied. #3 patient is to return to clinic on a PRN basis.   Edrick Kins, DPM [facility]  Dr. Edrick Kins, [address]                                        [place], [place] [address]                Office [phone]  Fax [phone]

## 2018-09-20 ENCOUNTER — Encounter: Payer: Self-pay | Admitting: *Deleted

## 2018-09-20 ENCOUNTER — Other Ambulatory Visit: Payer: Self-pay

## 2018-09-28 NOTE — Discharge Instructions (Signed)

## 2018-10-03 ENCOUNTER — Ambulatory Visit: Payer: Medicare Other | Admitting: Anesthesiology

## 2018-10-03 ENCOUNTER — Encounter
Admission: RE | Disposition: A | Discharge status: Home / Self Care | Payer: Self-pay | Source: Home / Self Care | Attending: Ophthalmology

## 2018-10-03 ENCOUNTER — Ambulatory Visit
Admission: RE | Admit: 2018-10-03 | Discharge: 2018-10-03 | Disposition: A | Discharge status: Home / Self Care | Payer: Medicare Other | Attending: Ophthalmology | Admitting: Ophthalmology

## 2018-10-03 DIAGNOSIS — M1990 Unspecified osteoarthritis, unspecified site: Secondary | ICD-10-CM | POA: Diagnosis not present

## 2018-10-03 DIAGNOSIS — Z87891 Personal history of nicotine dependence: Secondary | ICD-10-CM | POA: Diagnosis not present

## 2018-10-03 DIAGNOSIS — H2511 Age-related nuclear cataract, right eye: Secondary | ICD-10-CM | POA: Diagnosis present

## 2018-10-03 DIAGNOSIS — Z792 Long term (current) use of antibiotics: Secondary | ICD-10-CM | POA: Insufficient documentation

## 2018-10-03 HISTORY — DX: Unspecified osteoarthritis, unspecified site: M19.90

## 2018-10-03 HISTORY — PX: CATARACT EXTRACTION W/PHACO: SHX586

## 2018-10-03 SURGERY — PHACOEMULSIFICATION, CATARACT, WITH IOL INSERTION
Anesthesia: Monitor Anesthesia Care | Site: Eye | Laterality: Right

## 2018-10-03 MED ORDER — LIDOCAINE HCL (PF) 2 % IJ SOLN
INTRAOCULAR | Status: DC | PRN
  Administered 2018-10-03: 1 mL via INTRAOCULAR

## 2018-10-03 MED ORDER — MOXIFLOXACIN HCL 0.5 % OP SOLN
OPHTHALMIC | Status: DC | PRN
  Administered 2018-10-03: 0.2 mL via OPHTHALMIC

## 2018-10-03 MED ORDER — TETRACAINE HCL 0.5 % OP SOLN
1.0000 [drp] | OPHTHALMIC | Status: DC | PRN
  Administered 2018-10-03 (×3): 1 [drp] via OPHTHALMIC

## 2018-10-03 MED ORDER — EPINEPHRINE PF 1 MG/ML IJ SOLN
INTRAOCULAR | Status: DC | PRN
  Administered 2018-10-03: 83 mL via OPHTHALMIC

## 2018-10-03 MED ORDER — ACETAMINOPHEN 160 MG/5ML PO SOLN: 325.0000 mg | Freq: Once | ORAL | Status: DC

## 2018-10-03 MED ORDER — ARMC OPHTHALMIC DILATING DROPS
1.0000 "application " | OPHTHALMIC | Status: DC | PRN
  Administered 2018-10-03 (×3): 1 via OPHTHALMIC

## 2018-10-03 MED ORDER — SODIUM HYALURONATE 10 MG/ML IO SOLN
INTRAOCULAR | Status: DC | PRN
  Administered 2018-10-03: 0.55 mL via INTRAOCULAR

## 2018-10-03 MED ORDER — FENTANYL CITRATE (PF) 100 MCG/2ML IJ SOLN
INTRAMUSCULAR | Status: DC | PRN
  Administered 2018-10-03 (×2): 50 ug via INTRAVENOUS

## 2018-10-03 MED ORDER — SODIUM HYALURONATE 23 MG/ML IO SOLN
INTRAOCULAR | Status: DC | PRN
  Administered 2018-10-03: 0.6 mL via INTRAOCULAR

## 2018-10-03 MED ORDER — ACETAMINOPHEN 325 MG PO TABS: 325.0000 mg | ORAL_TABLET | Freq: Once | ORAL | Status: DC

## 2018-10-03 MED ORDER — LACTATED RINGERS IV SOLN: INTRAVENOUS | Status: DC

## 2018-10-03 MED ORDER — MIDAZOLAM HCL 2 MG/2ML IJ SOLN
INTRAMUSCULAR | Status: DC | PRN
  Administered 2018-10-03: 2 mg via INTRAVENOUS

## 2018-10-03 SURGICAL SUPPLY — 19 items
CANNULA ANT/CHMB 27G (MISCELLANEOUS) ×2 IMPLANT
CANNULA ANT/CHMB 27GA (MISCELLANEOUS) ×6 IMPLANT
DISSECTOR HYDRO NUCLEUS 50X22 (MISCELLANEOUS) ×3 IMPLANT
GLOVE SURG LX 7.5 STRW (GLOVE) ×2
GLOVE SURG LX STRL 7.5 STRW (GLOVE) ×1 IMPLANT
GLOVE SURG SYN 8.5  E (GLOVE) ×2
GLOVE SURG SYN 8.5 E (GLOVE) ×1 IMPLANT
GLOVE SURG SYN 8.5 PF PI (GLOVE) ×1 IMPLANT
GOWN STRL REUS W/ TWL LRG LVL3 (GOWN DISPOSABLE) ×2 IMPLANT
GOWN STRL REUS W/TWL LRG LVL3 (GOWN DISPOSABLE) ×4
LENS IOL TECNIS SYMFONY 22.5 ×2 IMPLANT
MARKER SKIN DUAL TIP RULER LAB (MISCELLANEOUS) ×3 IMPLANT
PACK DR. KING ARMS (PACKS) ×3 IMPLANT
PACK EYE AFTER SURG (MISCELLANEOUS) ×3 IMPLANT
PACK OPTHALMIC (MISCELLANEOUS) ×3 IMPLANT
SYR 3ML LL SCALE MARK (SYRINGE) ×3 IMPLANT
SYR TB 1ML LUER SLIP (SYRINGE) ×3 IMPLANT
WATER STERILE IRR 500ML POUR (IV SOLUTION) ×3 IMPLANT
WIPE NON LINTING 3.25X3.25 (MISCELLANEOUS) ×3 IMPLANT

## 2018-10-03 NOTE — Anesthesia Procedure Notes (Signed)
Procedure Name: MAC Performed by: Izetta Dakin, CRNA Pre-anesthesia Checklist: Timeout performed, Patient being monitored, Suction available, Emergency Drugs available and Patient identified Patient Re-evaluated:Patient Re-evaluated prior to induction Oxygen Delivery Method: Nasal cannula

## 2018-10-03 NOTE — Anesthesia Postprocedure Evaluation (Signed)
Anesthesia Post Note  Patient: Harshini Trent  Procedure(s) Performed: CATARACT EXTRACTION PHACO AND INTRAOCULAR LENS PLACEMENT (IOC) RIGHT SYMFONY MULTIFOCAL TORIC (Right Eye)  Patient location during evaluation: PACU Anesthesia Type: MAC Level of consciousness: awake and alert and oriented Pain management: satisfactory to patient Vital Signs Assessment: post-procedure vital signs reviewed and stable Respiratory status: spontaneous breathing, nonlabored ventilation and respiratory function stable Cardiovascular status: blood pressure returned to baseline and stable Postop Assessment: Adequate PO intake and No signs of nausea or vomiting Anesthetic complications: no    Raliegh Ip

## 2018-10-03 NOTE — Op Note (Signed)
OPERATIVE NOTE  Nycole Kawahara [id number] 10/03/2018   PREOPERATIVE DIAGNOSIS:  Nuclear sclerotic cataract right eye.  H25.11   POSTOPERATIVE DIAGNOSIS:    Nuclear sclerotic cataract right eye.     PROCEDURE:  Phacoemusification with posterior chamber intraocular lens placement of the right eye   LENS:   Implant Name Type Inv. Item Serial No. Manufacturer Lot No. LRB No. Used  LENS IOL SYMFONY 22.5 - [id number]  LENS IOL SYMFONY 22.5 [id number] AMO  Right 1       [id number] +22.5   ULTRASOUND TIME: 1 minutes 06 seconds.  CDE 7.21   SURGEON:  Benay Pillow, MD, MPH  ANESTHESIOLOGIST: Anesthesiologist: Ronelle Nigh, MD CRNA: Izetta Dakin, CRNA   ANESTHESIA:  Topical with tetracaine drops augmented with 1% preservative-free intracameral lidocaine.  ESTIMATED BLOOD LOSS: less than 1 mL.   COMPLICATIONS:  None.   DESCRIPTION OF PROCEDURE:  The patient was identified in the holding room and transported to the operating room and placed in the supine position under the operating microscope.  The right eye was identified as the operative eye and it was prepped and draped in the usual sterile ophthalmic fashion.   A 1.0 millimeter clear-corneal paracentesis was made at the 10:30 position. 0.5 ml of preservative-free 1% lidocaine with epinephrine was injected into the anterior chamber.  The anterior chamber was filled with Healon 5 viscoelastic.  A 2.4 millimeter keratome was used to make a near-clear corneal incision at the 8:00 position.  A curvilinear capsulorrhexis was made with a cystotome and capsulorrhexis forceps.  Balanced salt solution was used to hydrodissect and hydrodelineate the nucleus.   Phacoemulsification was then used in stop and chop fashion to remove the lens nucleus and epinucleus.  The remaining cortex was then removed using the irrigation and aspiration handpiece. Healon was then placed into the capsular bag to distend it for lens placement.  A lens was then  injected into the capsular bag.  The remaining viscoelastic was aspirated.   Wounds were hydrated with balanced salt solution.  The anterior chamber was inflated to a physiologic pressure with balanced salt solution.  The lens was well centered by Purkinje reflections.  Intracameral vigamox 0.1 mL undiluted was injected into the eye and a drop placed onto the ocular surface.  No wound leaks were noted.  The patient was taken to the recovery room in stable condition without complications of anesthesia or surgery  Benay Pillow 10/03/2018, 10:55 AM

## 2018-10-03 NOTE — Transfer of Care (Signed)
Immediate Anesthesia Transfer of Care Note  Patient: Jamie Horn  Procedure(s) Performed: CATARACT EXTRACTION PHACO AND INTRAOCULAR LENS PLACEMENT (IOC) RIGHT SYMFONY MULTIFOCAL TORIC (Right Eye)  Patient Location: PACU  Anesthesia Type: MAC  Level of Consciousness: awake, alert  and patient cooperative  Airway and Oxygen Therapy: Patient Spontanous Breathing and Patient connected to supplemental oxygen  Post-op Assessment: Post-op Vital signs reviewed, Patient's Cardiovascular Status Stable, Respiratory Function Stable, Patent Airway and No signs of Nausea or vomiting  Post-op Vital Signs: Reviewed and stable  Complications: No apparent anesthesia complications

## 2018-10-03 NOTE — Anesthesia Preprocedure Evaluation (Signed)
Anesthesia Evaluation  Patient identified by MRN, date of birth, ID band Patient awake    Reviewed: Allergy & Precautions, H&P , NPO status , Patient's Chart, lab work & pertinent test results  Airway Mallampati: II  TM Distance: >3 FB Neck ROM: full    Dental no notable dental hx.    Pulmonary former smoker,    Pulmonary exam normal breath sounds clear to auscultation       Cardiovascular Normal cardiovascular exam Rhythm:regular Rate:Normal     Neuro/Psych    GI/Hepatic   Endo/Other    Renal/GU      Musculoskeletal   Abdominal   Peds  Hematology   Anesthesia Other Findings   Reproductive/Obstetrics                             Anesthesia Physical Anesthesia Plan  ASA: II  Anesthesia Plan: MAC   Post-op Pain Management:    Induction: Intravenous  PONV Risk Score and Plan: 2 and Treatment may vary due to age or medical condition and Midazolam  Airway Management Planned:   Additional Equipment:   Intra-op Plan:   Post-operative Plan:   Informed Consent: I have reviewed the patients History and Physical, chart, labs and discussed the procedure including the risks, benefits and alternatives for the proposed anesthesia with the patient or authorized representative who has indicated his/her understanding and acceptance.       Plan Discussed with: CRNA  Anesthesia Plan Comments:         Anesthesia Quick Evaluation

## 2018-10-03 NOTE — H&P (Signed)

## 2018-10-04 ENCOUNTER — Encounter: Payer: Self-pay | Admitting: Ophthalmology

## 2018-10-31 ENCOUNTER — Ambulatory Visit: Admit: 2018-10-31 | Payer: Medicare Other | Admitting: Ophthalmology

## 2018-10-31 SURGERY — PHACOEMULSIFICATION, CATARACT, WITH IOL INSERTION
Anesthesia: Topical | Laterality: Left

## 2018-11-07 ENCOUNTER — Other Ambulatory Visit: Payer: Self-pay | Admitting: Internal Medicine

## 2018-11-07 DIAGNOSIS — Z1231 Encounter for screening mammogram for malignant neoplasm of breast: Secondary | ICD-10-CM

## 2018-12-14 ENCOUNTER — Encounter: Payer: Self-pay | Admitting: *Deleted

## 2018-12-14 ENCOUNTER — Other Ambulatory Visit: Payer: Self-pay

## 2018-12-15 ENCOUNTER — Encounter: Payer: Self-pay | Admitting: Anesthesiology

## 2018-12-15 ENCOUNTER — Other Ambulatory Visit
Admission: RE | Admit: 2018-12-15 | Discharge: 2018-12-15 | Disposition: A | Discharge status: Home / Self Care | Payer: Medicare Other | Source: Ambulatory Visit | Attending: Ophthalmology | Admitting: Ophthalmology

## 2018-12-15 DIAGNOSIS — Z1159 Encounter for screening for other viral diseases: Secondary | ICD-10-CM | POA: Diagnosis present

## 2018-12-16 LAB — NOVEL CORONAVIRUS, NAA (HOSP ORDER, SEND-OUT TO REF LAB; TAT 18-24 HRS): SARS-CoV-2, NAA: NOT DETECTED

## 2018-12-16 NOTE — Discharge Instructions (Signed)

## 2018-12-19 ENCOUNTER — Ambulatory Visit
Admission: RE | Admit: 2018-12-19 | Discharge: 2018-12-19 | Disposition: A | Discharge status: Home Health | Payer: Medicare Other | Attending: Ophthalmology | Admitting: Ophthalmology

## 2018-12-19 ENCOUNTER — Ambulatory Visit: Payer: Medicare Other | Admitting: Anesthesiology

## 2018-12-19 ENCOUNTER — Encounter
Admission: RE | Disposition: A | Discharge status: Home Health | Payer: Self-pay | Source: Home / Self Care | Attending: Ophthalmology

## 2018-12-19 DIAGNOSIS — E669 Obesity, unspecified: Secondary | ICD-10-CM | POA: Diagnosis not present

## 2018-12-19 DIAGNOSIS — M1990 Unspecified osteoarthritis, unspecified site: Secondary | ICD-10-CM | POA: Insufficient documentation

## 2018-12-19 DIAGNOSIS — Z9841 Cataract extraction status, right eye: Secondary | ICD-10-CM | POA: Diagnosis not present

## 2018-12-19 DIAGNOSIS — H2512 Age-related nuclear cataract, left eye: Secondary | ICD-10-CM | POA: Diagnosis present

## 2018-12-19 DIAGNOSIS — Z96651 Presence of right artificial knee joint: Secondary | ICD-10-CM | POA: Diagnosis not present

## 2018-12-19 DIAGNOSIS — Z888 Allergy status to other drugs, medicaments and biological substances status: Secondary | ICD-10-CM | POA: Diagnosis not present

## 2018-12-19 DIAGNOSIS — Z87891 Personal history of nicotine dependence: Secondary | ICD-10-CM | POA: Insufficient documentation

## 2018-12-19 DIAGNOSIS — Z6836 Body mass index (BMI) 36.0-36.9, adult: Secondary | ICD-10-CM | POA: Insufficient documentation

## 2018-12-19 HISTORY — PX: CATARACT EXTRACTION W/PHACO: SHX586

## 2018-12-19 SURGERY — PHACOEMULSIFICATION, CATARACT, WITH IOL INSERTION
Anesthesia: Monitor Anesthesia Care | Site: Eye | Laterality: Left

## 2018-12-19 MED ORDER — LACTATED RINGERS IV SOLN: 10.0000 mL/h | INTRAVENOUS | Status: DC

## 2018-12-19 MED ORDER — MOXIFLOXACIN HCL 0.5 % OP SOLN
OPHTHALMIC | Status: DC | PRN
  Administered 2018-12-19: 0.2 mL via OPHTHALMIC

## 2018-12-19 MED ORDER — ARMC OPHTHALMIC DILATING DROPS
1.0000 "application " | OPHTHALMIC | Status: DC | PRN
  Administered 2018-12-19 (×3): 1 via OPHTHALMIC

## 2018-12-19 MED ORDER — EPINEPHRINE PF 1 MG/ML IJ SOLN
INTRAOCULAR | Status: DC | PRN
  Administered 2018-12-19: 11:00:00 95 mL via OPHTHALMIC

## 2018-12-19 MED ORDER — FENTANYL CITRATE (PF) 100 MCG/2ML IJ SOLN
INTRAMUSCULAR | Status: DC | PRN
  Administered 2018-12-19: 50 ug via INTRAVENOUS

## 2018-12-19 MED ORDER — SODIUM HYALURONATE 23 MG/ML IO SOLN
INTRAOCULAR | Status: DC | PRN
  Administered 2018-12-19: 0.6 mL via INTRAOCULAR

## 2018-12-19 MED ORDER — MIDAZOLAM HCL 2 MG/2ML IJ SOLN
INTRAMUSCULAR | Status: DC | PRN
  Administered 2018-12-19: 2 mg via INTRAVENOUS

## 2018-12-19 MED ORDER — TETRACAINE HCL 0.5 % OP SOLN
1.0000 [drp] | OPHTHALMIC | Status: DC | PRN
  Administered 2018-12-19 (×3): 1 [drp] via OPHTHALMIC

## 2018-12-19 MED ORDER — SODIUM HYALURONATE 10 MG/ML IO SOLN
INTRAOCULAR | Status: DC | PRN
  Administered 2018-12-19: 0.55 mL via INTRAOCULAR

## 2018-12-19 MED ORDER — LIDOCAINE HCL (PF) 2 % IJ SOLN
INTRAOCULAR | Status: DC | PRN
  Administered 2018-12-19: 11:00:00 2 mL via INTRAOCULAR

## 2018-12-19 SURGICAL SUPPLY — 20 items
CANNULA ANT/CHMB 27G (MISCELLANEOUS) ×2 IMPLANT
CANNULA ANT/CHMB 27GA (MISCELLANEOUS) ×6 IMPLANT
CARTRIDGE ABBOTT (MISCELLANEOUS) ×2 IMPLANT
DISSECTOR HYDRO NUCLEUS 50X22 (MISCELLANEOUS) ×3 IMPLANT
GLOVE SURG LX 7.5 STRW (GLOVE) ×2
GLOVE SURG LX STRL 7.5 STRW (GLOVE) ×1 IMPLANT
GLOVE SURG SYN 8.5  E (GLOVE) ×2
GLOVE SURG SYN 8.5 E (GLOVE) ×1 IMPLANT
GLOVE SURG SYN 8.5 PF PI (GLOVE) ×1 IMPLANT
GOWN STRL REUS W/ TWL LRG LVL3 (GOWN DISPOSABLE) ×2 IMPLANT
GOWN STRL REUS W/TWL LRG LVL3 (GOWN DISPOSABLE) ×4
LENS IOL TECNIS SYMFONY 22.5 ×2 IMPLANT
MARKER SKIN DUAL TIP RULER LAB (MISCELLANEOUS) ×3 IMPLANT
PACK DR. KING ARMS (PACKS) ×3 IMPLANT
PACK EYE AFTER SURG (MISCELLANEOUS) ×3 IMPLANT
PACK OPTHALMIC (MISCELLANEOUS) ×3 IMPLANT
SYR 3ML LL SCALE MARK (SYRINGE) ×3 IMPLANT
SYR TB 1ML LUER SLIP (SYRINGE) ×3 IMPLANT
WATER STERILE IRR 500ML POUR (IV SOLUTION) ×3 IMPLANT
WIPE NON LINTING 3.25X3.25 (MISCELLANEOUS) ×3 IMPLANT

## 2018-12-19 NOTE — Anesthesia Preprocedure Evaluation (Signed)
Anesthesia Evaluation  Patient identified by MRN, date of birth, ID band Patient awake    Reviewed: Allergy & Precautions, H&P , NPO status , Patient's Chart, lab work & pertinent test results  Airway Mallampati: II  TM Distance: >3 FB Neck ROM: full    Dental no notable dental hx.    Pulmonary former smoker,    Pulmonary exam normal breath sounds clear to auscultation       Cardiovascular Normal cardiovascular exam Rhythm:regular Rate:Normal     Neuro/Psych    GI/Hepatic   Endo/Other  Obesity - BMI 36  Renal/GU      Musculoskeletal  (+) Arthritis ,   Abdominal   Peds  Hematology   Anesthesia Other Findings   Reproductive/Obstetrics                             Anesthesia Physical  Anesthesia Plan  ASA: II  Anesthesia Plan: MAC   Post-op Pain Management:    Induction: Intravenous  PONV Risk Score and Plan: 2 and Treatment may vary due to age or medical condition and Midazolam  Airway Management Planned:   Additional Equipment:   Intra-op Plan:   Post-operative Plan:   Informed Consent: I have reviewed the patients History and Physical, chart, labs and discussed the procedure including the risks, benefits and alternatives for the proposed anesthesia with the patient or authorized representative who has indicated his/her understanding and acceptance.       Plan Discussed with: CRNA  Anesthesia Plan Comments:         Anesthesia Quick Evaluation

## 2018-12-19 NOTE — Anesthesia Procedure Notes (Signed)
Procedure Name: MAC Date/Time: 12/19/2018 10:46 AM Performed by: Cameron Ali, CRNA Pre-anesthesia Checklist: Patient identified, Emergency Drugs available, Suction available, Timeout performed and Patient being monitored Patient Re-evaluated:Patient Re-evaluated prior to induction Oxygen Delivery Method: Nasal cannula Placement Confirmation: positive ETCO2

## 2018-12-19 NOTE — Anesthesia Postprocedure Evaluation (Signed)
Anesthesia Post Note  Patient: Jamie Horn  Procedure(s) Performed: CATARACT EXTRACTION PHACO AND INTRAOCULAR LENS PLACEMENT ([facility])  LEFT SYMFONY MULTIFOCAL TORIC (Left Eye)  Patient location during evaluation: PACU Anesthesia Type: MAC Level of consciousness: awake and alert Pain management: pain level controlled Vital Signs Assessment: post-procedure vital signs reviewed and stable Respiratory status: spontaneous breathing, nonlabored ventilation, respiratory function stable and patient connected to nasal cannula oxygen Cardiovascular status: stable and blood pressure returned to baseline Postop Assessment: no apparent nausea or vomiting Anesthetic complications: no    Veda Canning

## 2018-12-19 NOTE — Transfer of Care (Signed)
Immediate Anesthesia Transfer of Care Note  Patient: Jamie Horn  Procedure(s) Performed: CATARACT EXTRACTION PHACO AND INTRAOCULAR LENS PLACEMENT ([place])  LEFT SYMFONY MULTIFOCAL TORIC (Left Eye)  Patient Location: PACU  Anesthesia Type: MAC  Level of Consciousness: awake, alert  and patient cooperative  Airway and Oxygen Therapy: Patient Spontanous Breathing and Patient connected to supplemental oxygen  Post-op Assessment: Post-op Vital signs reviewed, Patient's Cardiovascular Status Stable, Respiratory Function Stable, Patent Airway and No signs of Nausea or vomiting  Post-op Vital Signs: Reviewed and stable  Complications: No apparent anesthesia complications

## 2018-12-19 NOTE — H&P (Signed)

## 2018-12-19 NOTE — Op Note (Signed)
OPERATIVE NOTE  Jamie Horn [id number] 12/19/2018   PREOPERATIVE DIAGNOSIS:  Nuclear sclerotic cataract left eye.  H25.12   POSTOPERATIVE DIAGNOSIS:    Nuclear sclerotic cataract left eye.     PROCEDURE:  Phacoemusification with posterior chamber intraocular lens placement of the left eye   LENS:   Implant Name Type Inv. Item Serial No. Manufacturer Lot No. LRB No. Used  LENS IOL SYMFONY 22.5 - [id number]  LENS IOL SYMFONY 22.5 [id number] AMO  Left 1       [id number] +22.5   ULTRASOUND TIME: 1 minutes 07 seconds.  CDE 7.15   SURGEON:  Benay Pillow, MD, MPH   ANESTHESIA:  Topical with tetracaine drops augmented with 1% preservative-free intracameral lidocaine.  ESTIMATED BLOOD LOSS: <1 mL   COMPLICATIONS:  None.   DESCRIPTION OF PROCEDURE:  The patient was identified in the holding room and transported to the operating room and placed in the supine position under the operating microscope.  The left eye was identified as the operative eye and it was prepped and draped in the usual sterile ophthalmic fashion.   A 1.0 millimeter clear-corneal paracentesis was made at the 5:00 position. 0.5 ml of preservative-free 1% lidocaine with epinephrine was injected into the anterior chamber.  The anterior chamber was filled with Healon 5 viscoelastic.  A 2.4 millimeter keratome was used to make a near-clear corneal incision at the 2:00 position.  A curvilinear capsulorrhexis was made with a cystotome and capsulorrhexis forceps.  Balanced salt solution was used to hydrodissect and hydrodelineate the nucleus.   Phacoemulsification was then used in stop and chop fashion to remove the lens nucleus and epinucleus.  The remaining cortex was then removed using the irrigation and aspiration handpiece. Healon was then placed into the capsular bag to distend it for lens placement.  A lens was then injected into the capsular bag.  The remaining viscoelastic was aspirated.  The lens was well centered  and the rhexis was overlapping the edges of the optic evenly.     Wounds were hydrated with balanced salt solution.  The anterior chamber was inflated to a physiologic pressure with balanced salt solution.  Intracameral vigamox 0.1 mL undiltued was injected into the eye and a drop placed onto the ocular surface.  No wound leaks were noted.  The patient was taken to the recovery room in stable condition without complications of anesthesia or surgery  Benay Pillow 12/19/2018, 11:09 AM

## 2018-12-20 ENCOUNTER — Encounter: Payer: Self-pay | Admitting: Ophthalmology

## 2019-01-17 ENCOUNTER — Other Ambulatory Visit: Payer: Self-pay

## 2019-01-17 ENCOUNTER — Ambulatory Visit
Admission: RE | Admit: 2019-01-17 | Discharge: 2019-01-17 | Disposition: A | Discharge status: Home / Self Care | Payer: Medicare Other | Source: Ambulatory Visit | Attending: Internal Medicine | Admitting: Internal Medicine

## 2019-01-17 DIAGNOSIS — Z1231 Encounter for screening mammogram for malignant neoplasm of breast: Secondary | ICD-10-CM

## 2019-03-28 ENCOUNTER — Other Ambulatory Visit: Payer: Self-pay

## 2019-03-28 ENCOUNTER — Encounter (INDEPENDENT_AMBULATORY_CARE_PROVIDER_SITE_OTHER): Payer: Self-pay | Admitting: Vascular Surgery

## 2019-03-28 ENCOUNTER — Encounter (INDEPENDENT_AMBULATORY_CARE_PROVIDER_SITE_OTHER): Payer: Self-pay

## 2019-03-28 ENCOUNTER — Ambulatory Visit (INDEPENDENT_AMBULATORY_CARE_PROVIDER_SITE_OTHER): Payer: Medicare Other | Admitting: Vascular Surgery

## 2019-03-28 VITALS — BP 143/75 | HR 80 | Resp 16 | Ht 64.0 in | Wt 209.6 lb

## 2019-03-28 DIAGNOSIS — I83813 Varicose veins of bilateral lower extremities with pain: Secondary | ICD-10-CM | POA: Diagnosis not present

## 2019-03-28 DIAGNOSIS — Z96652 Presence of left artificial knee joint: Secondary | ICD-10-CM | POA: Diagnosis not present

## 2019-03-28 DIAGNOSIS — M7989 Other specified soft tissue disorders: Secondary | ICD-10-CM

## 2019-03-28 NOTE — Assessment & Plan Note (Signed)
Arthritic issues can contribute to some of her lower extremity symptoms of pain and swelling

## 2019-03-28 NOTE — Patient Instructions (Signed)

## 2019-03-28 NOTE — Assessment & Plan Note (Signed)
Likely multifactorial but venous insufficiency could be playing a major contributing role.  Work-up as planned below

## 2019-03-28 NOTE — Progress Notes (Signed)
Patient ID: Jamie Horn, female   DOB: October 06, 1940, 78 y.o.   MRN: [id number]  Chief Complaint  Patient presents with  . New Patient (Initial Visit)    ref Jamie Horn for varicose veins    HPI Jamie Horn is a 78 y.o. female.  I am asked to see the patient by Dr. Nehemiah Horn for evaluation of venous insufficiency.  Patient reports increasing pain in her prominent varicosities over the past several years.  She has a longstanding history of varicosities that initially were not all that bothersome.  Now, the varicosities locally itch and burn.  She also describes how her legs feel sore and full particularly at the end of the day.  Elevation has helped him somewhat.  She has intermittently used compression stockings with little improvement thus far.  She reports no previous history of DVT or superficial thrombophlebitis.  No open ulcerations or infection.  No chest pain or shortness of breath.  Both lower extremities are affected somewhat equally.  The varicosities have also gotten more prominent over time.     Past Medical History:  Diagnosis Date  . Arthritis     Past Surgical History:  Procedure Laterality Date  . CATARACT EXTRACTION W/PHACO Right 10/03/2018   Procedure: CATARACT EXTRACTION PHACO AND INTRAOCULAR LENS PLACEMENT ([place]) RIGHT SYMFONY MULTIFOCAL TORIC;  Surgeon: Eulogio Bear, MD;  Location: [facility];  Service: Ophthalmology;  Laterality: Right;  . CATARACT EXTRACTION W/PHACO Left 12/19/2018   Procedure: CATARACT EXTRACTION PHACO AND INTRAOCULAR LENS PLACEMENT ([place])  LEFT SYMFONY MULTIFOCAL TORIC;  Surgeon: Eulogio Bear, MD;  Location: [facility];  Service: Ophthalmology;  Laterality: Left;  . colonosc    . HERNIA REPAIR    . JOINT REPLACEMENT Bilateral    knees  . KNEE SURGERY     partial knee replacement     Family History Family History  Problem Relation Age of Onset  . Breast cancer Sister 77  No bleeding disorders, clotting  disorders, aneurysms, or autoimmune diseases I treat her husband for leg swelling  Social History Social History   Tobacco Use  . Smoking status: Former Smoker    Quit date: 2006    Years since quitting: 14.6  . Smokeless tobacco: Never Used  Substance Use Topics  . Alcohol use: No  . Drug use: No  Cares for her husband who has multiple medical issues and severe leg swelling  Allergies  Allergen Reactions  . Tape Other (See Comments)    blisters    Current Outpatient Medications  Medication Sig Dispense Refill  . doxycycline (DORYX) 100 MG EC tablet Take 100 mg by mouth daily.    Marland Kitchen terbinafine (LAMISIL) 250 MG tablet Take 250 mg by mouth daily.    . Vitamin D, Ergocalciferol, (DRISDOL) 50000 units CAPS capsule TAKE 1 CAPSULE (50,000 UNITS TOTAL) BY MOUTH ONCE A WEEK.     No current facility-administered medications for this visit.       REVIEW OF SYSTEMS (Negative unless checked)  Constitutional: [] Weight loss  [] Fever  [] Chills Cardiac: [] Chest pain   [] Chest pressure   [] Palpitations   [] Shortness of breath when laying flat   [] Shortness of breath at rest   [] Shortness of breath with exertion. Vascular:  [] Pain in legs with walking   [] Pain in legs at rest   [] Pain in legs when laying flat   [] Claudication   [] Pain in feet when walking  [] Pain in feet at rest  [] Pain in feet  when laying flat   [] History of DVT   [] Phlebitis   [x] Swelling in legs   [x] Varicose veins   [] Non-healing ulcers Pulmonary:   [] Uses home oxygen   [] Productive cough   [] Hemoptysis   [] Wheeze  [] COPD   [] Asthma Neurologic:  [] Dizziness  [] Blackouts   [] Seizures   [] History of stroke   [] History of TIA  [] Aphasia   [] Temporary blindness   [] Dysphagia   [] Weakness or numbness in arms   [] Weakness or numbness in legs Musculoskeletal:  [x] Arthritis   [] Joint swelling   [] Joint pain   [] Low back pain Hematologic:  [] Easy bruising  [] Easy bleeding   [] Hypercoagulable state   [] Anemic  [] Hepatitis  Gastrointestinal:  [] Blood in stool   [] Vomiting blood  [] Gastroesophageal reflux/heartburn   [] Abdominal pain Genitourinary:  [] Chronic kidney disease   [] Difficult urination  [] Frequent urination  [] Burning with urination   [] Hematuria Skin:  [] Rashes   [] Ulcers   [] Wounds Psychological:  [] History of anxiety   []  History of major depression.    Physical Exam BP (!) 143/75 (BP Location: Right Arm)   Pulse 80   Resp 16   Ht 5\' 4"  (1.626 m)   Wt 209 lb 9.6 oz (95.1 kg)   BMI 35.98 kg/m  Gen:  WD/WN, NAD.  Appears younger than stated age Head: [place]/AT, No temporalis wasting.  Ear/Nose/Throat: Hearing grossly intact, nares w/o erythema or drainage, oropharynx w/o Erythema/Exudate Eyes: Conjunctiva clear, sclera non-icteric  Neck: trachea midline.  No JVD.  Pulmonary:  Good air movement, respirations not labored, no use of accessory muscles  Cardiac: RRR, no JVD Vascular:  Vessel Right Left  Radial Palpable Palpable                          PT 1+ 1+  DP 2+ 2+   Gastrointestinal:. No masses, surgical incisions, or scars. Musculoskeletal: M/S 5/5 throughout.  Extremities without ischemic changes.  No deformity or atrophy.  Diffuse varicosities bilaterally measuring about 2 mm in largest diameter.  Trace bilateral lower extremity edema. Neurologic: Sensation grossly intact in extremities.  Symmetrical.  Speech is fluent. Motor exam as listed above. Psychiatric: Judgment intact, Mood & affect appropriate for pt's clinical situation. Dermatologic: No rashes or ulcers noted.  No cellulitis or open wounds.    Radiology No results found.  Labs No results found for this or any previous visit (from the past 2160 hour(s)).  Assessment/Plan:  Status post total left knee replacement Arthritic issues can contribute to some of her lower extremity symptoms of pain and swelling  Swelling of limb Likely multifactorial but venous insufficiency could be playing a major contributing  role.  Work-up as planned below  Varicose veins of leg with pain, bilateral  Recommend:  The patient has large symptomatic varicose veins that are painful and associated with swelling.  I have had a long discussion with the patient regarding  varicose veins and why they cause symptoms.  Patient will begin wearing graduated compression stockings class 1 on a daily basis, beginning first thing in the morning and removing them in the evening. The patient is instructed specifically not to sleep in the stockings.    The patient  will also begin using over-the-counter analgesics such as Motrin 600 mg po TID to help control the symptoms.    In addition, behavioral modification including elevation during the day will be initiated.    Pending the results of these changes the  patient will be  reevaluated in three months.   An  ultrasound of the venous system will be obtained.   Further plans will be based on the ultrasound results and whether conservative therapies are successful at eliminating the pain and swelling.       Leotis Pain 03/28/2019, 12:33 PM   This note was created with Dragon medical transcription system.  Any errors from dictation are unintentional.

## 2019-03-28 NOTE — Assessment & Plan Note (Signed)
Recommend:  The patient has large symptomatic varicose veins that are painful and associated with swelling.  I have had a long discussion with the patient regarding  varicose veins and why they cause symptoms.  Patient will begin wearing graduated compression stockings class 1 on a daily basis, beginning first thing in the morning and removing them in the evening. The patient is instructed specifically not to sleep in the stockings.    The patient  will also begin using over-the-counter analgesics such as Motrin 600 mg po TID to help control the symptoms.    In addition, behavioral modification including elevation during the day will be initiated.    Pending the results of these changes the  patient will be reevaluated in three months.   An  ultrasound of the venous system will be obtained.   Further plans will be based on the ultrasound results and whether conservative therapies are successful at eliminating the pain and swelling.  

## 2019-03-31 ENCOUNTER — Ambulatory Visit (INDEPENDENT_AMBULATORY_CARE_PROVIDER_SITE_OTHER): Payer: Medicare Other

## 2019-03-31 ENCOUNTER — Encounter (INDEPENDENT_AMBULATORY_CARE_PROVIDER_SITE_OTHER): Payer: Self-pay | Admitting: Nurse Practitioner

## 2019-03-31 ENCOUNTER — Ambulatory Visit (INDEPENDENT_AMBULATORY_CARE_PROVIDER_SITE_OTHER): Payer: Medicare Other | Admitting: Nurse Practitioner

## 2019-03-31 ENCOUNTER — Other Ambulatory Visit: Payer: Self-pay

## 2019-03-31 VITALS — BP 163/88 | HR 80 | Resp 16

## 2019-03-31 DIAGNOSIS — M1710 Unilateral primary osteoarthritis, unspecified knee: Secondary | ICD-10-CM

## 2019-03-31 DIAGNOSIS — I83811 Varicose veins of right lower extremities with pain: Secondary | ICD-10-CM

## 2019-03-31 DIAGNOSIS — I83812 Varicose veins of left lower extremities with pain: Secondary | ICD-10-CM | POA: Diagnosis not present

## 2019-03-31 DIAGNOSIS — I83813 Varicose veins of bilateral lower extremities with pain: Secondary | ICD-10-CM | POA: Diagnosis not present

## 2019-04-02 ENCOUNTER — Encounter (INDEPENDENT_AMBULATORY_CARE_PROVIDER_SITE_OTHER): Payer: Self-pay | Admitting: Nurse Practitioner

## 2019-04-02 NOTE — Progress Notes (Signed)
SUBJECTIVE:  Patient ID: Jamie Horn, female    DOB: April 19, 1941, 78 y.o.   MRN: [id number] Chief Complaint  Patient presents with  . Follow-up    ultrasound follow up    HPI  Jamie Horn is a 78 y.o. female The patient is seen for evaluation of symptomatic varicose veins. The patient relates burning and stinging which worsened steadily throughout the course of the day, particularly with standing. The patient also notes an aching and throbbing pain over the varicosities, particularly with prolonged dependent positions. The symptoms are significantly improved with elevation.  The patient also notes that during hot weather the symptoms are greatly intensified. The patient states the pain from the varicose veins interferes with work, daily exercise, shopping and household maintenance. At this point, the symptoms are persistent and severe enough that they're having a negative impact on lifestyle and are interfering with daily activities.  There is no history of DVT, PE or superficial thrombophlebitis. There is no history of ulceration or hemorrhage. The patient denies a significant family history of varicose veins.   The patient has recently begun worn graduated compression in the past. At the present time the patient has been using over-the-counter analgesics. There is no history of prior surgical intervention or sclerotherapy.    Past Medical History:  Diagnosis Date  . Arthritis     Past Surgical History:  Procedure Laterality Date  . CATARACT EXTRACTION W/PHACO Right 10/03/2018   Procedure: CATARACT EXTRACTION PHACO AND INTRAOCULAR LENS PLACEMENT ([place]) RIGHT SYMFONY MULTIFOCAL TORIC;  Surgeon: Eulogio Bear, MD;  Location: [facility];  Service: Ophthalmology;  Laterality: Right;  . CATARACT EXTRACTION W/PHACO Left 12/19/2018   Procedure: CATARACT EXTRACTION PHACO AND INTRAOCULAR LENS PLACEMENT ([place])  LEFT SYMFONY MULTIFOCAL TORIC;  Surgeon: Eulogio Bear, MD;  Location: [facility];  Service: Ophthalmology;  Laterality: Left;  . colonosc    . HERNIA REPAIR    . JOINT REPLACEMENT Bilateral    knees  . KNEE SURGERY     partial knee replacement     Social History   Socioeconomic History  . Marital status: Married    Spouse name: Not on file  . Number of children: Not on file  . Years of education: Not on file  . Highest education level: Not on file  Occupational History  . Not on file  Social Needs  . Financial resource strain: Not on file  . Food insecurity    Worry: Not on file    Inability: Not on file  . Transportation needs    Medical: Not on file    Non-medical: Not on file  Tobacco Use  . Smoking status: Former Smoker    Quit date: 2006    Years since quitting: 14.6  . Smokeless tobacco: Never Used  Substance and Sexual Activity  . Alcohol use: No  . Drug use: No  . Sexual activity: Not on file  Lifestyle  . Physical activity    Days per week: Not on file    Minutes per session: Not on file  . Stress: Not on file  Relationships  . Social Herbalist on phone: Not on file    Gets together: Not on file    Attends religious service: Not on file    Active member of club or organization: Not on file    Attends meetings of clubs or organizations: Not on file    Relationship status: Not on file  .  Intimate partner violence    Fear of current or ex partner: Not on file    Emotionally abused: Not on file    Physically abused: Not on file    Forced sexual activity: Not on file  Other Topics Concern  . Not on file  Social History Narrative  . Not on file    Family History  Problem Relation Age of Onset  . Breast cancer Sister 69    Allergies  Allergen Reactions  . Tape Other (See Comments)    blisters     Review of Systems   Review of Systems: Negative Unless Checked Constitutional: [] Weight loss  [] Fever  [] Chills Cardiac: [] Chest pain   []  Atrial Fibrillation  [] Palpitations    [] Shortness of breath when laying flat   [] Shortness of breath with exertion. [] Shortness of breath at rest Vascular:  [] Pain in legs with walking   [] Pain in legs with standing [] Pain in legs when laying flat   [] Claudication    [] Pain in feet when laying flat    [] History of DVT   [] Phlebitis   [x] Swelling in legs   [x] Varicose veins   [] Non-healing ulcers Pulmonary:   [] Uses home oxygen   [] Productive cough   [] Hemoptysis   [] Wheeze  [] COPD   [] Asthma Neurologic:  [] Dizziness   [] Seizures  [] Blackouts [] History of stroke   [] History of TIA  [] Aphasia   [] Temporary Blindness   [] Weakness or numbness in arm   [] Weakness or numbness in leg Musculoskeletal:   [] Joint swelling   [] Joint pain   [] Low back pain  []  History of Knee Replacement [x] Arthritis [] back Surgeries  []  Spinal Stenosis    Hematologic:  [] Easy bruising  [] Easy bleeding   [] Hypercoagulable state   [] Anemic Gastrointestinal:  [] Diarrhea   [] Vomiting  [] Gastroesophageal reflux/heartburn   [] Difficulty swallowing. [] Abdominal pain Genitourinary:  [] Chronic kidney disease   [] Difficult urination  [] Anuric   [] Blood in urine [] Frequent urination  [] Burning with urination   [] Hematuria Skin:  [] Rashes   [] Ulcers [] Wounds Psychological:  [] History of anxiety   []  History of major depression  []  Memory Difficulties      OBJECTIVE:   Physical Exam  BP (!) 163/88 (BP Location: Right Arm)   Pulse 80   Resp 16   Gen: WD/WN, NAD Head: [place]/AT, No temporalis wasting.  Ear/Nose/Throat: Hearing grossly intact, nares w/o erythema or drainage Eyes: PER, EOMI, sclera nonicteric.  Neck: Supple, no masses.  No JVD.  Pulmonary:  Good air movement, no use of accessory muscles.  Cardiac: RRR Vascular: scattered varicosities present bilaterally.  Mild venous stasis changes to the legs bilaterally.  2+ soft pitting edema  Vessel Right Left  Radial Palpable Palpable  Dorsalis Pedis Palpable Palpable  Posterior Tibial Palpable Palpable    Gastrointestinal: soft, non-distended. No guarding/no peritoneal signs.  Musculoskeletal: M/S 5/5 throughout.  No deformity or atrophy.  Neurologic: Pain and light touch intact in extremities.  Symmetrical.  Speech is fluent. Motor exam as listed above. Psychiatric: Judgment intact, Mood & affect appropriate for pt's clinical situation. Dermatologic: No Venous rashes. No Ulcers Noted.  No changes consistent with cellulitis. Lymph : No Cervical lymphadenopathy, no lichenification or skin changes of chronic lymphedema.       ASSESSMENT AND PLAN:  1. Varicose veins of leg with pain, bilateral  Recommend:  The patient has large symptomatic varicose veins that are painful and associated with swelling.  I have had a long discussion with the patient regarding  varicose veins and  why they cause symptoms.  Patient will continue wearing graduated compression stockings class 1 on a daily basis, beginning first thing in the morning and removing them in the evening. The patient is instructed specifically not to sleep in the stockings.    The patient  will also begin using over-the-counter analgesics such as Motrin 600 mg po TID to help control the symptoms.    In addition, behavioral modification including elevation during the day will be continued.    Pending the results of these changes the  patient will be reevaluated in three months.    Further plans will be based on the ultrasound results and whether conservative therapies are successful at eliminating the pain and swelling.   2. Primary osteoarthritis of knee, unspecified laterality Continue NSAID medications as already ordered, these medications have been reviewed and there are no changes at this time.  Continued activity and therapy was stressed.    Current Outpatient Medications on File Prior to Visit  Medication Sig Dispense Refill  . doxycycline (DORYX) 100 MG EC tablet Take 100 mg by mouth daily.    Marland Kitchen terbinafine (LAMISIL) 250 MG  tablet Take 250 mg by mouth daily.    . Vitamin D, Ergocalciferol, (DRISDOL) 50000 units CAPS capsule TAKE 1 CAPSULE (50,000 UNITS TOTAL) BY MOUTH ONCE A WEEK.     No current facility-administered medications on file prior to visit.     There are no Patient Instructions on file for this visit. Return in about 3 months (around 07/01/2019) for Varicose veins.   Kris Hartmann, NP  This note was completed with Sales executive.  Any errors are purely unintentional.

## 2019-07-03 ENCOUNTER — Ambulatory Visit (INDEPENDENT_AMBULATORY_CARE_PROVIDER_SITE_OTHER): Payer: Medicare Other | Admitting: Nurse Practitioner

## 2019-07-11 ENCOUNTER — Encounter (INDEPENDENT_AMBULATORY_CARE_PROVIDER_SITE_OTHER): Payer: Self-pay | Admitting: Vascular Surgery

## 2019-07-11 ENCOUNTER — Other Ambulatory Visit: Payer: Self-pay

## 2019-07-11 ENCOUNTER — Encounter (INDEPENDENT_AMBULATORY_CARE_PROVIDER_SITE_OTHER): Payer: Self-pay

## 2019-07-11 ENCOUNTER — Ambulatory Visit (INDEPENDENT_AMBULATORY_CARE_PROVIDER_SITE_OTHER): Payer: Medicare Other | Admitting: Vascular Surgery

## 2019-07-11 VITALS — BP 148/78 | HR 70 | Resp 16 | Wt 212.0 lb

## 2019-07-11 DIAGNOSIS — I83813 Varicose veins of bilateral lower extremities with pain: Secondary | ICD-10-CM | POA: Diagnosis not present

## 2019-07-11 DIAGNOSIS — R600 Localized edema: Secondary | ICD-10-CM | POA: Diagnosis not present

## 2019-07-11 DIAGNOSIS — M7989 Other specified soft tissue disorders: Secondary | ICD-10-CM

## 2019-07-11 NOTE — Patient Instructions (Signed)
Nonsurgical Procedures for Varicose Veins Various nonsurgical procedures can be used to treat varicose veins. Varicose veins are swollen, twisted veins that are visible under the skin. They occur most often in the legs. These veins may appear blue and bulging. Varicose veins are caused by damage to the valves in veins. All veins have a valve that makes blood flow in only one direction. If a valve gets weak or damaged, blood can pool and cause varicose veins. You may need a procedure to treat your varicose veins if they are causing symptoms or complications, or if lifestyle changes have not helped. These procedures can reduce pain, aching, and the risk of bleeding and blood clots. They can also improve the way the affected area looks (cosmetic appearance). The three common nonsurgical procedures are:  Sclerotherapy. A chemical is injected to close off a vein.  Laser treatment. Light energy is applied to close off the vein.  Radiofrequency vein ablation. Electrical energy is used to produce heat that closes off the vein. Your health care provider will discuss the method that is best for you based on your condition. Tell a health care provider about:  Any allergies you have.  All medicines you are taking, including vitamins, herbs, eye drops, creams, and over-the-counter medicines.  Any problems you or family members have had with anesthetic medicines.  Any blood disorders you have.  Any surgeries you have had.  Any medical conditions you have.  Whether you are pregnant or may be pregnant. What are the risks? Generally, this is a safe procedure. However, problems may occur, including:  Damage to nearby nerves, tissues, or veins.  Skin irritation, sores, or dark spots.  Numbness.  Clotting.  Infection.  Allergic reactions to medicines.  Scarring.  Leg swelling.  Need for additional treatments.  Bruising. What happens before the procedure?  Ask your health care provider  about: ? Changing or stopping your regular medicines. This is especially important if you are taking diabetes medicines or blood thinners. ? Taking over-the-counter medicines, vitamins, herbs, and supplements. ? Taking medicines such as aspirin and ibuprofen. These medicines can thin your blood. Do not take these medicines unless your health care provider tells you to take them.  You may have an exam or testing. This can include a tests to: ? Check for clots and check blood flow using sound waves (Doppler ultrasound). ? Observe how blood flows through your veins by injecting a dye that outlines your veins on X-rays (angiogram). This test is used in rare cases. What happens during the procedure? One of the following procedures will be performed: Sclerotherapy This procedure is often used for small to medium veins.  A chemical (sclerosant) that irritates the lining of the vein will be injected into the vein. This will cause the varicose vein to be closed off. Sclerosants in different amounts and strengths can be used, depending on the size and location of the vein.  All of the varicose vein sites will be injected. You may need more than one treatment because new varicose veins may develop, or more than one injection may be needed for each varicose vein.  Laser treatment There are two ways that lasers are used to treat varicose veins:  Light energy from a laser may be directed onto the vein through the skin.  A needle may be used to pass a thin laser catheter into the vein to cause it to close. You may need more than one treatment if the vein re-opens. In some cases,   laser treatment may be combined with sclerotherapy. Radiofrequency vein ablation   You will be given a medicine that numbs the area (local anesthetic).  A small incision will be made near the varicose vein.  A thin tube (catheter) will be threaded into your vein.  The tip of the catheter will deploy electrodes.  The  electrodes will deliver electrical energy to produce heat that closes off the vein. What happens after the procedure?  A bandage (dressing) may be used to cover the injection site or incisions.  You may have to wear compression stockings. These stockings help to prevent blood clots and reduce swelling in your legs.  Return to your normal activities as told by your health care provider. Summary  Varicose veins are swollen, twisted veins that are visible under the skin. They occur most often in the legs.  Various procedures can be used to treat varicose veins. You may need a procedure to treat your varicose veins if they are causing symptoms or complications, or if lifestyle changes have not helped.  Your health care provider will discuss the method that is best for you based on your condition. This information is not intended to replace advice given to you by your health care provider. Make sure you discuss any questions you have with your health care provider. Document Released: 10/30/2016 Document Revised: 11/11/2018 Document Reviewed: 10/30/2016 Elsevier Patient Education  2020 [employer]  

## 2019-07-11 NOTE — Assessment & Plan Note (Signed)
Recommend  I have reviewed my previous  discussion with the patient regarding  varicose veins and why they cause symptoms. Patient will continue  wearing graduated compression stockings class 1 on a daily basis, beginning first thing in the morning and removing them in the evening.    In addition, behavioral modification including elevation during the day was again discussed and this will continue.  The patient has utilized over the counter pain medications and has been exercising.  However, at this time conservative therapy has not alleviated the patient's symptoms of leg pain and swelling  Recommend: laser ablation of the right and  left great saphenous veins to eliminate the symptoms of pain and swelling of the lower extremities caused by the severe superficial venous reflux disease.  If symptoms persist after this, would consider laser ablation of the small saphenous vein bilaterally as well.

## 2019-07-11 NOTE — Progress Notes (Signed)
MRN : [id number]  Jamie Horn is a 78 y.o. (February 27, 1941) female who presents with chief complaint of  Chief Complaint  Patient presents with  . Follow-up    47month follow up  .  History of Present Illness: Patient returns today in follow up of venous insufficiency.  Since her last visit over 3 months ago, she has diligently been wearing compression stockings, elevating her legs, and ambulating.  Despite this, she has had no improvement in the pain and swelling in her legs.  Both legs are affected.  A previously performed venous duplex demonstrated bilateral great saphenous vein and small saphenous vein reflux.  Current Outpatient Medications  Medication Sig Dispense Refill  . doxycycline (DORYX) 100 MG EC tablet Take 100 mg by mouth daily.    . Vitamin D, Ergocalciferol, (DRISDOL) 50000 units CAPS capsule TAKE 1 CAPSULE (50,000 UNITS TOTAL) BY MOUTH ONCE A WEEK.    . terbinafine (LAMISIL) 250 MG tablet Take 250 mg by mouth daily.     No current facility-administered medications for this visit.     Past Medical History:  Diagnosis Date  . Arthritis     Past Surgical History:  Procedure Laterality Date  . CATARACT EXTRACTION W/PHACO Right 10/03/2018   Procedure: CATARACT EXTRACTION PHACO AND INTRAOCULAR LENS PLACEMENT ([place]) RIGHT SYMFONY MULTIFOCAL TORIC;  Surgeon: Eulogio Bear, MD;  Location: [facility];  Service: Ophthalmology;  Laterality: Right;  . CATARACT EXTRACTION W/PHACO Left 12/19/2018   Procedure: CATARACT EXTRACTION PHACO AND INTRAOCULAR LENS PLACEMENT ([place])  LEFT SYMFONY MULTIFOCAL TORIC;  Surgeon: Eulogio Bear, MD;  Location: [facility];  Service: Ophthalmology;  Laterality: Left;  . colonosc    . HERNIA REPAIR    . JOINT REPLACEMENT Bilateral    knees  . KNEE SURGERY     partial knee replacement     Social History Social History   Tobacco Use  . Smoking status: Former Smoker    Quit date: 2006    Years since quitting:  14.9  . Smokeless tobacco: Never Used  Substance Use Topics  . Alcohol use: No  . Drug use: No    Family History  Problem Relation Age of Onset  . Breast cancer Sister 91  No bleeding or clotting disorders  Allergies  Allergen Reactions  . Tape Other (See Comments)    blisters     REVIEW OF SYSTEMS (Negative unless checked)  Constitutional: [] Weight loss  [] Fever  [] Chills Cardiac: [] Chest pain   [] Chest pressure   [] Palpitations   [] Shortness of breath when laying flat   [] Shortness of breath at rest   [] Shortness of breath with exertion. Vascular:  [x] Pain in legs with walking   [x] Pain in legs at rest   [] Pain in legs when laying flat   [] Claudication   [] Pain in feet when walking  [] Pain in feet at rest  [] Pain in feet when laying flat   [] History of DVT   [] Phlebitis   [x] Swelling in legs   [x] Varicose veins   [] Non-healing ulcers Pulmonary:   [] Uses home oxygen   [] Productive cough   [] Hemoptysis   [] Wheeze  [] COPD   [] Asthma Neurologic:  [] Dizziness  [] Blackouts   [] Seizures   [] History of stroke   [] History of TIA  [] Aphasia   [] Temporary blindness   [] Dysphagia   [] Weakness or numbness in arms   [] Weakness or numbness in legs Musculoskeletal:  [x] Arthritis   [] Joint swelling   [x] Joint pain   [] Low back  pain Hematologic:  [] Easy bruising  [] Easy bleeding   [] Hypercoagulable state   [] Anemic   Gastrointestinal:  [] Blood in stool   [] Vomiting blood  [] Gastroesophageal reflux/heartburn   [] Abdominal pain Genitourinary:  [] Chronic kidney disease   [] Difficult urination  [] Frequent urination  [] Burning with urination   [] Hematuria Skin:  [] Rashes   [] Ulcers   [] Wounds Psychological:  [] History of anxiety   []  History of major depression.  Physical Examination  BP (!) 148/78 (BP Location: Left Arm)   Pulse 70   Resp 16   Wt 212 lb (96.2 kg)   BMI 36.39 kg/m  Gen:  WD/WN, NAD.  Appears younger than stated age Head: [place]/AT, No temporalis wasting. Ear/Nose/Throat: Hearing  grossly intact, nares w/o erythema or drainage Eyes: Conjunctiva clear. Sclera non-icteric Neck: Supple.  Trachea midline Pulmonary:  Good air movement, no use of accessory muscles.  Cardiac: RRR, no JVD Vascular:  Vessel Right Left  Radial Palpable Palpable                           Musculoskeletal: M/S 5/5 throughout.  No deformity or atrophy.  Mild bilateral lower extremity edema.  Varicosities present bilaterally. Neurologic: Sensation grossly intact in extremities.  Symmetrical.  Speech is fluent.  Psychiatric: Judgment intact, Mood & affect appropriate for pt's clinical situation. Dermatologic: No rashes or ulcers noted.  No cellulitis or open wounds.       Labs No results found for this or any previous visit (from the past 2160 hour(s)).  Radiology No results found.  Assessment/Plan  Swelling of limb Likely due to venous insufficiency.  Varicose veins of leg with pain, bilateral Recommend  I have reviewed my previous  discussion with the patient regarding  varicose veins and why they cause symptoms. Patient will continue  wearing graduated compression stockings class 1 on a daily basis, beginning first thing in the morning and removing them in the evening.    In addition, behavioral modification including elevation during the day was again discussed and this will continue.  The patient has utilized over the counter pain medications and has been exercising.  However, at this time conservative therapy has not alleviated the patient's symptoms of leg pain and swelling  Recommend: laser ablation of the right and  left great saphenous veins to eliminate the symptoms of pain and swelling of the lower extremities caused by the severe superficial venous reflux disease.  If symptoms persist after this, would consider laser ablation of the small saphenous vein bilaterally as well.    Leotis Pain, MD  07/11/2019 2:08 PM    This note was created with Dragon medical  transcription system.  Any errors from dictation are purely unintentional

## 2019-07-11 NOTE — Assessment & Plan Note (Signed)
Likely due to venous insufficiency.

## 2019-07-25 ENCOUNTER — Telehealth: Payer: Self-pay

## 2020-01-09 ENCOUNTER — Other Ambulatory Visit: Payer: Self-pay | Admitting: Internal Medicine

## 2020-01-09 DIAGNOSIS — Z1231 Encounter for screening mammogram for malignant neoplasm of breast: Secondary | ICD-10-CM

## 2020-01-22 ENCOUNTER — Ambulatory Visit
Admission: RE | Admit: 2020-01-22 | Discharge: 2020-01-22 | Disposition: A | Discharge status: Home / Self Care | Payer: Medicare PPO | Source: Ambulatory Visit | Attending: Internal Medicine | Admitting: Internal Medicine

## 2020-01-22 DIAGNOSIS — Z1231 Encounter for screening mammogram for malignant neoplasm of breast: Secondary | ICD-10-CM | POA: Insufficient documentation

## 2020-03-04 ENCOUNTER — Other Ambulatory Visit: Payer: Self-pay

## 2020-03-04 ENCOUNTER — Ambulatory Visit: Payer: Medicare Other | Admitting: Dermatology

## 2020-03-04 DIAGNOSIS — L304 Erythema intertrigo: Secondary | ICD-10-CM

## 2020-03-04 DIAGNOSIS — L821 Other seborrheic keratosis: Secondary | ICD-10-CM

## 2020-03-04 DIAGNOSIS — I8310 Varicose veins of unspecified lower extremity with inflammation: Secondary | ICD-10-CM

## 2020-03-04 DIAGNOSIS — L719 Rosacea, unspecified: Secondary | ICD-10-CM

## 2020-03-04 DIAGNOSIS — L578 Other skin changes due to chronic exposure to nonionizing radiation: Secondary | ICD-10-CM

## 2020-03-04 DIAGNOSIS — L820 Inflamed seborrheic keratosis: Secondary | ICD-10-CM

## 2020-03-04 DIAGNOSIS — Z1283 Encounter for screening for malignant neoplasm of skin: Secondary | ICD-10-CM

## 2020-03-04 DIAGNOSIS — D229 Melanocytic nevi, unspecified: Secondary | ICD-10-CM

## 2020-03-04 DIAGNOSIS — L814 Other melanin hyperpigmentation: Secondary | ICD-10-CM

## 2020-03-04 DIAGNOSIS — D1800 Hemangioma unspecified site: Secondary | ICD-10-CM

## 2020-03-04 MED ORDER — KETOCONAZOLE 2 % EX CREA: 1.0000 "application " | TOPICAL_CREAM | Freq: Every day | CUTANEOUS | 4 refills | Status: AC

## 2020-03-04 MED ORDER — METRONIDAZOLE 0.75 % EX LOTN: 1.0000 "application " | TOPICAL_LOTION | Freq: Every day | CUTANEOUS | 11 refills | Status: DC

## 2020-03-04 MED ORDER — DOXYCYCLINE MONOHYDRATE 100 MG PO CAPS: 100.0000 mg | ORAL_CAPSULE | Freq: Every day | ORAL | 11 refills | Status: AC

## 2020-03-04 NOTE — Progress Notes (Addendum)
Follow-Up Visit   Subjective  Jamie Horn is a 79 y.o. female who presents for the following: Annual Exam (Total body skin exam) and Rosacea (face, doxycycline 100mg  prn). The patient presents for Total-Body Skin Exam (TBSE) for skin cancer screening and mole check.  The following portions of the chart were reviewed this encounter and updated as appropriate:  Tobacco  Allergies  Meds  Problems  Med Hx  Surg Hx  Fam Hx     Review of Systems:  No other skin or systemic complaints except as noted in HPI or Assessment and Plan.  Objective  Well appearing patient in no apparent distress; mood and affect are within normal limits.  A full examination was performed including scalp, head, eyes, ears, nose, lips, neck, chest, axillae, abdomen, back, buttocks, bilateral upper extremities, bilateral lower extremities, hands, feet, fingers, toes, fingernails, and toenails. All findings within normal limits unless otherwise noted below.  Objective  face: Erythema face, and eyelid rims  Objective  Inframammary: Erythema inframammary  Objective  R scalp x 2, L side braline x 1 (3): Erythematous keratotic or waxy stuck-on papules  R lat breast x 1: 1.5cm brown plaque   Assessment & Plan    Lentigines - Scattered tan macules - Discussed due to sun exposure - Benign, observe - Call for any changes  Seborrheic Keratoses - Stuck-on, waxy, tan-brown papules and plaques  - Discussed benign etiology and prognosis. - Observe - Call for any changes  Melanocytic Nevi - Tan-brown and/or pink-flesh-colored symmetric macules and papules - Benign appearing on exam today - Observation - Call clinic for new or changing moles - Recommend daily use of broad spectrum spf 30+ sunscreen to sun-exposed areas.   Hemangiomas - Red papules - Discussed benign nature - Observe - Call for any changes  Actinic Damage - diffuse scaly erythematous macules with underlying  dyspigmentation - Recommend daily broad spectrum sunscreen SPF 30+ to sun-exposed areas, reapply every 2 hours as needed.  - Call for new or changing lesions.  Skin cancer screening performed today.  Varicose Veins - Dilated blue, purple or red veins at the lower extremities - Reassured - These can be treated by sclerotherapy (a procedure to inject a medicine into the veins to make them disappear) if desired, but the treatment is not covered by insurance  Rosacea face  With Ocular Rosacea  Cont Doxycycline 100mg  1 po qd prn flares, take with food and drink Start Metrolotion qhs to face  doxycycline (MONODOX) 100 MG capsule - face  METRONIDAZOLE, TOPICAL, (METROLOTION) 0.75 % LOTN - face  Intertrigo Inframammary  Start Ketoconazole 2% cr qd  ketoconazole (NIZORAL) 2 % cream - Inframammary  Inflamed seborrheic keratosis (4) R scalp x 2, L side braline x 1 (3); R lat breast x 1  Traumatized ISK R lat breast Shave removal x 1   Destruction of lesion - R scalp x 2, L side braline x 1 Complexity: simple   Destruction method: cryotherapy   Informed consent: discussed and consent obtained   Timeout:  patient name, date of birth, surgical site, and procedure verified Lesion destroyed using liquid nitrogen: Yes   Region frozen until ice ball extended beyond lesion: Yes   Outcome: patient tolerated procedure well with no complications   Post-procedure details: wound care instructions given    Epidermal / dermal shaving - R lat breast x 1  Lesion diameter (cm):  1.5 Informed consent: discussed and consent obtained   Timeout: patient name, date  of birth, surgical site, and procedure verified   Procedure prep:  Patient was prepped and draped in usual sterile fashion Prep type:  Isopropyl alcohol Anesthesia: the lesion was anesthetized in a standard fashion   Anesthetic:  1% lidocaine w/ epinephrine 1-100,000 buffered w/ 8.4% NaHCO3 Instrument used: flexible razor blade     Hemostasis achieved with: pressure, aluminum chloride and electrodesiccation   Outcome: patient tolerated procedure well   Post-procedure details: sterile dressing applied and wound care instructions given   Dressing type: bandage and petrolatum    Return in about 1 year (around 03/04/2021) for TBSE.   I, Othelia Pulling, RMA, am acting as scribe for Sarina Ser, MD .  Documentation: I have reviewed the above documentation for accuracy and completeness, and I agree with the above.  Sarina Ser, MD

## 2020-03-04 NOTE — Patient Instructions (Signed)

## 2020-03-05 ENCOUNTER — Encounter: Payer: Self-pay | Admitting: Dermatology

## 2020-04-09 ENCOUNTER — Ambulatory Visit (INDEPENDENT_AMBULATORY_CARE_PROVIDER_SITE_OTHER): Payer: Medicare PPO | Admitting: Vascular Surgery

## 2020-05-17 ENCOUNTER — Ambulatory Visit (INDEPENDENT_AMBULATORY_CARE_PROVIDER_SITE_OTHER): Payer: Medicare PPO | Admitting: Vascular Surgery

## 2020-05-17 ENCOUNTER — Encounter (INDEPENDENT_AMBULATORY_CARE_PROVIDER_SITE_OTHER): Payer: Self-pay | Admitting: Vascular Surgery

## 2020-05-17 ENCOUNTER — Other Ambulatory Visit: Payer: Self-pay

## 2020-05-17 VITALS — BP 148/82 | HR 76 | Resp 16 | Wt 209.0 lb

## 2020-05-17 DIAGNOSIS — I83813 Varicose veins of bilateral lower extremities with pain: Secondary | ICD-10-CM

## 2020-05-17 NOTE — Progress Notes (Signed)
  Jamie Horn is a 79 y.o. female who presents with symptomatic venous reflux  Past Medical History:  Diagnosis Date  . Arthritis     Past Surgical History:  Procedure Laterality Date  . CATARACT EXTRACTION W/PHACO Right 10/03/2018   Procedure: CATARACT EXTRACTION PHACO AND INTRAOCULAR LENS PLACEMENT ([place]) RIGHT SYMFONY MULTIFOCAL TORIC;  Surgeon: Eulogio Bear, MD;  Location: [facility];  Service: Ophthalmology;  Laterality: Right;  . CATARACT EXTRACTION W/PHACO Left 12/19/2018   Procedure: CATARACT EXTRACTION PHACO AND INTRAOCULAR LENS PLACEMENT ([place])  LEFT SYMFONY MULTIFOCAL TORIC;  Surgeon: Eulogio Bear, MD;  Location: [facility];  Service: Ophthalmology;  Laterality: Left;  . colonosc    . HERNIA REPAIR    . JOINT REPLACEMENT Bilateral    knees  . KNEE SURGERY     partial knee replacement      Current Outpatient Medications:  Marland Kitchen  METRONIDAZOLE, TOPICAL, (METROLOTION) 0.75 % LOTN, Apply 1 application topically at bedtime., Disp: 59 mL, Rfl: 11 .  terbinafine (LAMISIL) 250 MG tablet, Take 250 mg by mouth daily., Disp: , Rfl:  .  Vitamin D, Ergocalciferol, (DRISDOL) 50000 units CAPS capsule, TAKE 1 CAPSULE (50,000 UNITS TOTAL) BY MOUTH ONCE A WEEK., Disp: , Rfl:  .  doxycycline (DORYX) 100 MG EC tablet, Take 100 mg by mouth daily. (Patient not taking: Reported on 05/17/2020), Disp: , Rfl:  .  doxycycline (MONODOX) 100 MG capsule, Take 1 capsule (100 mg total) by mouth daily. Take with food and drink (Patient not taking: Reported on 05/17/2020), Disp: 30 capsule, Rfl: 11  Allergies  Allergen Reactions  . Tape Other (See Comments)    blisters     Varicose veins of leg with pain, bilateral   PLAN: The patient's right lower extremity was sterilely prepped and draped. The ultrasound machine was used to visualize the saphenous vein throughout its course. A segment in the mid calf was selected for access. The saphenous vein was accessed without  difficulty using ultrasound guidance with a micropuncture needle. A 0.018 wire was then placed beyond the saphenofemoral junction and the needle was removed. The 65 cm sheath was then placed over the wire and the wire and dilator were removed. The laser fiber was then placed through the sheath and its tip was placed approximately 4-5 centimeters below the saphenofemoral junction. Tumescent anesthesia was then created with a dilute lidocaine solution. Laser energy was then delivered with constant withdrawal of the sheath and laser fiber. Approximately 1536 joules of energy were delivered over a length of 41 centimeters using a 1470 Hz VenaCure machine at 7 W. Sterile dressings were placed. The patient tolerated the procedure well without obvious complications.   Follow-up in 1 week with post-laser duplex.

## 2020-05-20 ENCOUNTER — Other Ambulatory Visit: Payer: Self-pay

## 2020-05-20 ENCOUNTER — Ambulatory Visit (INDEPENDENT_AMBULATORY_CARE_PROVIDER_SITE_OTHER): Payer: Medicare PPO

## 2020-05-20 DIAGNOSIS — I83813 Varicose veins of bilateral lower extremities with pain: Secondary | ICD-10-CM

## 2020-06-14 ENCOUNTER — Other Ambulatory Visit: Payer: Self-pay

## 2020-06-14 ENCOUNTER — Encounter (INDEPENDENT_AMBULATORY_CARE_PROVIDER_SITE_OTHER): Payer: Self-pay | Admitting: Vascular Surgery

## 2020-06-14 ENCOUNTER — Other Ambulatory Visit (INDEPENDENT_AMBULATORY_CARE_PROVIDER_SITE_OTHER): Payer: Self-pay | Admitting: Vascular Surgery

## 2020-06-14 ENCOUNTER — Ambulatory Visit (INDEPENDENT_AMBULATORY_CARE_PROVIDER_SITE_OTHER): Payer: Medicare PPO | Admitting: Vascular Surgery

## 2020-06-14 VITALS — BP 166/86 | HR 78 | Resp 16 | Wt 208.8 lb

## 2020-06-14 DIAGNOSIS — I83813 Varicose veins of bilateral lower extremities with pain: Secondary | ICD-10-CM

## 2020-06-14 NOTE — Progress Notes (Signed)
  Jamie Horn is a 79 y.o. female who presents with symptomatic venous reflux  Past Medical History:  Diagnosis Date  . Arthritis     Past Surgical History:  Procedure Laterality Date  . CATARACT EXTRACTION W/PHACO Right 10/03/2018   Procedure: CATARACT EXTRACTION PHACO AND INTRAOCULAR LENS PLACEMENT ([place]) RIGHT SYMFONY MULTIFOCAL TORIC;  Surgeon: Eulogio Bear, MD;  Location: [facility];  Service: Ophthalmology;  Laterality: Right;  . CATARACT EXTRACTION W/PHACO Left 12/19/2018   Procedure: CATARACT EXTRACTION PHACO AND INTRAOCULAR LENS PLACEMENT ([place])  LEFT SYMFONY MULTIFOCAL TORIC;  Surgeon: Eulogio Bear, MD;  Location: [facility];  Service: Ophthalmology;  Laterality: Left;  . colonosc    . HERNIA REPAIR    . JOINT REPLACEMENT Bilateral    knees  . KNEE SURGERY     partial knee replacement      Current Outpatient Medications:  Marland Kitchen  METRONIDAZOLE, TOPICAL, (METROLOTION) 0.75 % LOTN, Apply 1 application topically at bedtime., Disp: 59 mL, Rfl: 11 .  terbinafine (LAMISIL) 250 MG tablet, Take 250 mg by mouth daily., Disp: , Rfl:  .  Vitamin D, Ergocalciferol, (DRISDOL) 50000 units CAPS capsule, TAKE 1 CAPSULE (50,000 UNITS TOTAL) BY MOUTH ONCE A WEEK., Disp: , Rfl:  .  doxycycline (DORYX) 100 MG EC tablet, Take 100 mg by mouth daily. (Patient not taking: Reported on 05/17/2020), Disp: , Rfl:  .  doxycycline (MONODOX) 100 MG capsule, Take 1 capsule (100 mg total) by mouth daily. Take with food and drink (Patient not taking: Reported on 05/17/2020), Disp: 30 capsule, Rfl: 11  Allergies  Allergen Reactions  . Tape Other (See Comments)    blisters     Varicose veins of leg with pain, bilateral     PLAN: The patient's left lower extremity was sterilely prepped and draped. The ultrasound machine was used to visualize the saphenous vein throughout its course. A segment in the upper calf was selected for access. The saphenous vein was accessed without  difficulty using ultrasound guidance with a micropuncture needle. A 0.018 wire was then placed beyond the saphenofemoral junction and the needle was removed. The 65 cm sheath was then placed over the wire and the wire and dilator were removed. The laser fiber was then placed through the sheath and its tip was placed approximately 5 centimeters below the saphenofemoral junction. Tumescent anesthesia was then created with a dilute lidocaine solution. Laser energy was then delivered with constant withdrawal of the sheath and laser fiber. Approximately 1284 joules of energy were delivered over a length of 34 centimeters using a 1470 Hz VenaCure machine at 7 W. Sterile dressings were placed. The patient tolerated the procedure well without obvious complications.   Follow-up in 1 week with post-laser duplex.

## 2020-06-17 ENCOUNTER — Ambulatory Visit (INDEPENDENT_AMBULATORY_CARE_PROVIDER_SITE_OTHER): Payer: Medicare PPO

## 2020-06-17 ENCOUNTER — Other Ambulatory Visit (INDEPENDENT_AMBULATORY_CARE_PROVIDER_SITE_OTHER): Payer: Self-pay | Admitting: Vascular Surgery

## 2020-06-17 ENCOUNTER — Other Ambulatory Visit: Payer: Self-pay

## 2020-06-17 DIAGNOSIS — I83819 Varicose veins of unspecified lower extremities with pain: Secondary | ICD-10-CM

## 2020-06-18 ENCOUNTER — Ambulatory Visit (INDEPENDENT_AMBULATORY_CARE_PROVIDER_SITE_OTHER): Payer: Medicare PPO

## 2020-06-18 ENCOUNTER — Ambulatory Visit
Admission: EM | Admit: 2020-06-18 | Discharge: 2020-06-18 | Disposition: A | Discharge status: Home / Self Care | Payer: Medicare PPO | Attending: Emergency Medicine | Admitting: Emergency Medicine

## 2020-06-18 ENCOUNTER — Other Ambulatory Visit: Payer: Self-pay

## 2020-06-18 DIAGNOSIS — W19XXXA Unspecified fall, initial encounter: Secondary | ICD-10-CM

## 2020-06-18 DIAGNOSIS — Y92019 Unspecified place in single-family (private) house as the place of occurrence of the external cause: Secondary | ICD-10-CM | POA: Diagnosis not present

## 2020-06-18 DIAGNOSIS — S52602A Unspecified fracture of lower end of left ulna, initial encounter for closed fracture: Secondary | ICD-10-CM

## 2020-06-18 DIAGNOSIS — S52502A Unspecified fracture of the lower end of left radius, initial encounter for closed fracture: Secondary | ICD-10-CM | POA: Diagnosis not present

## 2020-06-18 DIAGNOSIS — M25532 Pain in left wrist: Secondary | ICD-10-CM

## 2020-06-18 DIAGNOSIS — W010XXA Fall on same level from slipping, tripping and stumbling without subsequent striking against object, initial encounter: Secondary | ICD-10-CM

## 2020-06-18 MED ORDER — KETOROLAC TROMETHAMINE 60 MG/2ML IM SOLN
30.0000 mg | Freq: Once | INTRAMUSCULAR | Status: AC
  Administered 2020-06-18: 30 mg via INTRAMUSCULAR

## 2020-06-18 NOTE — ED Provider Notes (Signed)
[facility]    CSN: [id number] Arrival date & time: 06/18/20  1354      History   Chief Complaint Chief Complaint  Patient presents with  . Fall  . Wrist Pain    left    HPI Jamie Horn is a 79 y.o. female.   HPI   79 year old female here for evaluation of left wrist pain and swelling.  Patient reports that she was walking through her house and tripped over something causing her to fall forward.  She says she broke her fall by putting her hands out in front of her and took most of her weight on her left hand.  Patient is complaining of pain and swelling in her left wrist.  Patient denies numbness or tingling.  She does report that it hurts to move her fingers or try to move her wrist.  Patient has not taken anything for pain.  Past Medical History:  Diagnosis Date  . Arthritis     Patient Active Problem List   Diagnosis Date Noted  . Swelling of limb 03/28/2019  . Varicose veins of leg with pain, bilateral 03/28/2019  . Elevated blood sugar level 08/26/2017  . Low HDL (under 40) 08/26/2017  . Low serum vitamin D 08/26/2017  . Status post total left knee replacement 08/21/2015  . Obesity (BMI 30-39.9) 02/20/2015  . Primary osteoarthritis of knee 01/24/2015  . Rosacea 12/19/2013    Past Surgical History:  Procedure Laterality Date  . CATARACT EXTRACTION W/PHACO Right 10/03/2018   Procedure: CATARACT EXTRACTION PHACO AND INTRAOCULAR LENS PLACEMENT ([place]) RIGHT SYMFONY MULTIFOCAL TORIC;  Surgeon: Eulogio Bear, MD;  Location: [facility];  Service: Ophthalmology;  Laterality: Right;  . CATARACT EXTRACTION W/PHACO Left 12/19/2018   Procedure: CATARACT EXTRACTION PHACO AND INTRAOCULAR LENS PLACEMENT ([place])  LEFT SYMFONY MULTIFOCAL TORIC;  Surgeon: Eulogio Bear, MD;  Location: [facility];  Service: Ophthalmology;  Laterality: Left;  . colonosc    . HERNIA REPAIR    . JOINT REPLACEMENT Bilateral    knees  . KNEE SURGERY      partial knee replacement     OB History   No obstetric history on file.      Home Medications    Prior to Admission medications   Medication Sig Start Date End Date Taking? Authorizing Provider  doxycycline (MONODOX) 100 MG capsule Take 1 capsule (100 mg total) by mouth daily. Take with food and drink 03/04/20  Yes Ralene Bathe, MD  Vitamin D, Ergocalciferol, (DRISDOL) 50000 units CAPS capsule TAKE 1 CAPSULE (50,000 UNITS TOTAL) BY MOUTH ONCE A WEEK. 07/23/17  Yes [provider]    Family History Family History  Problem Relation Age of Onset  . Breast cancer Sister 6    Social History Social History   Tobacco Use  . Smoking status: Former Smoker    Quit date: 2006    Years since quitting: 15.8  . Smokeless tobacco: Never Used  Vaping Use  . Vaping Use: Never used  Substance Use Topics  . Alcohol use: No  . Drug use: No     Allergies   Tape   Review of Systems Review of Systems  Constitutional: Negative for activity change, appetite change and fever.  Respiratory: Negative for shortness of breath and wheezing.   Cardiovascular: Negative for chest pain.  Musculoskeletal: Positive for arthralgias and joint swelling.  Skin: Negative for color change and rash.  Neurological: Negative for syncope, weakness and numbness.  Hematological: Negative.   Psychiatric/Behavioral: Negative.      Physical Exam Triage Vital Signs ED Triage Vitals  Enc Vitals Group     BP 06/18/20 1457 (!) 157/77     Pulse Rate 06/18/20 1457 81     Resp 06/18/20 1457 17     Temp 06/18/20 1457 98.4 F (36.9 C)     Temp Source 06/18/20 1457 Oral     SpO2 06/18/20 1457 99 %     Weight 06/18/20 1455 205 lb (93 kg)     Height 06/18/20 1455 5\' 4"  (1.626 m)     Head Circumference --      Peak Flow --      Pain Score 06/18/20 1455 8     Pain Loc --      Pain Edu? --      Excl. in [place]? --    No data found.  Updated Vital Signs BP (!) 157/77 (BP Location: Right Arm)    Pulse 81   Temp 98.4 F (36.9 C) (Oral)   Resp 17   Ht 5\' 4"  (1.626 m)   Wt 205 lb (93 kg)   SpO2 99%   BMI 35.19 kg/m   Visual Acuity Right Eye Distance:   Left Eye Distance:   Bilateral Distance:    Right Eye Near:   Left Eye Near:    Bilateral Near:     Physical Exam Vitals and nursing note reviewed.  Constitutional:      General: She is not in acute distress.    Appearance: Normal appearance. She is ill-appearing. She is not toxic-appearing.     Comments: Patient appears to be in pain.  HENT:     Head: Normocephalic.  Eyes:     General: No scleral icterus.    Extraocular Movements: Extraocular movements intact.     Conjunctiva/sclera: Conjunctivae normal.     Pupils: Pupils are equal, round, and reactive to light.  Cardiovascular:     Rate and Rhythm: Normal rate and regular rhythm.     Pulses: Normal pulses.     Heart sounds: Normal heart sounds. No murmur heard.  No gallop.   Pulmonary:     Effort: Pulmonary effort is normal.     Breath sounds: No wheezing, rhonchi or rales.  Musculoskeletal:        General: Swelling, tenderness, deformity and signs of injury present.  Skin:    General: Skin is warm and dry.     Capillary Refill: Capillary refill takes less than 2 seconds.     Findings: No erythema or rash.  Neurological:     General: No focal deficit present.     Mental Status: She is alert and oriented to person, place, and time.  Psychiatric:        Mood and Affect: Mood normal.        Behavior: Behavior normal.        Thought Content: Thought content normal.        Judgment: Judgment normal.      UC Treatments / Results  Labs (all labs ordered are listed, but only abnormal results are displayed) Labs Reviewed - No data to display  EKG   Radiology DG Wrist Complete Left  Result Date: 06/18/2020 CLINICAL DATA:  79 year old female with fall and pain and deformity of the left wrist. EXAM: LEFT WRIST - COMPLETE 3+ VIEW COMPARISON:  None.  FINDINGS: There is a mildly displaced and angulated fracture of the distal radial metaphysis with mild  dorsal angulation of the distal fracture fragment. Longitudinal lucency extending from the transverse component of the fracture may extend to the articular surface. This is however suboptimally evaluated on the provided images. There is mildly displaced fracture of the ulnar styloid. There is no dislocation. The bones are osteopenic. There is mild soft tissue swelling of the wrist. IMPRESSION: Mildly displaced and angulated fracture of the distal radial metaphysis and mildly displaced fracture of the ulnar styloid. Electronically Signed   By: Anner Crete M.D.   On: 06/18/2020 15:54   VAS [place] LOWER EXTREMITY VENOUS POST ABLATION  Result Date: 06/18/2020  Lower Venous Reflux Study Indications: Lt GSV Post Ablation Imaging.  Performing Technologist: Almira Coaster RVS  Examination Guidelines: A complete evaluation includes B-mode imaging, spectral Doppler, color Doppler, and power Doppler as needed of all accessible portions of each vessel. Bilateral testing is considered an integral part of a complete examination. Limited examinations for reoccurring indications may be performed as noted. The reflux portion of the exam is performed with the patient in reverse Trendelenburg. Significant venous reflux is defined as >500 ms in the superficial venous system, and >1 second in the deep venous system.  +--------------+--------+------+----------+------------+-----------------------+ LEFT          Reflux  Reflux  Reflux  Diameter cmsComments                              No       Yes     Time                                       +--------------+--------+------+----------+------------+-----------------------+ CFV           no                                                          +--------------+--------+------+----------+------------+-----------------------+ FV prox       no                                                           +--------------+--------+------+----------+------------+-----------------------+ FV mid                 yes   2061 ms                                      +--------------+--------+------+----------+------------+-----------------------+ FV dist       no                                                          +--------------+--------+------+----------+------------+-----------------------+ Popliteal     no                                                          +--------------+--------+------+----------+------------+-----------------------+  GSV at [facility]    no                                  prior                                                                     ablation/stripping      +--------------+--------+------+----------+------------+-----------------------+ GSV prox thighno                                  prior                                                                     ablation/stripping      +--------------+--------+------+----------+------------+-----------------------+ GSV mid thigh no                                  prior                                                                     ablation/stripping      +--------------+--------+------+----------+------------+-----------------------+ GSV dist thighno                                  prior                                                                     ablation/stripping      +--------------+--------+------+----------+------------+-----------------------+ GSV at knee   no                                  prior                                                                     ablation/stripping      +--------------+--------+------+----------+------------+-----------------------+ GSV prox calf no  prior                                                                      ablation/stripping      +--------------+--------+------+----------+------------+-----------------------+ SSV Pop Fossa          yes   4401 ms                                      +--------------+--------+------+----------+------------+-----------------------+   Summary: Left: - No evidence of deep vein thrombosis seen in the left lower extremity, from the common femoral through the popliteal veins.  - No evidence of superficial venous reflux seen in the left greater saphenous vein.  - Venous reflux is noted in the left short saphenous vein. - The Left GSV appears to be occluded via Ablation from the Level of the knee to approximately 3.13cms from the SFJ.  *See table(s) above for measurements and observations. Electronically signed by Leotis Pain MD on 06/18/2020 at 8:26:15 AM.    Final     Procedures Procedures (including critical care time)  Medications Ordered in UC Medications  ketorolac (TORADOL) injection 30 mg (30 mg Intramuscular Given 06/18/20 1523)    Initial Impression / Assessment and Plan / UC Course  I have reviewed the triage vital signs and the nursing notes.  Pertinent labs & imaging results that were available during my care of the patient were reviewed by me and considered in my medical decision making (see chart for details).   Patient is here for evaluation of pain and swelling in her left wrist after a fall this afternoon.  Patient has pain and swelling at the carpal radial joint of the left wrist.  There is also a deformity present.  Patient has tenderness to palpation of the radial and ulnar styloids.  Patient has tenderness to the distal aspect of the radius and ulna.  Patient cannot flex, extend, radial deviate, or ulnar deviate her wrist without pain.  Patient also cannot supinate her left wrist without pain.  Radial and ulnar pulses are 2+, cap refill is less than 2 seconds.  The fingers of her left hand are warm and have full mobility.  Patient does  have pain in her wrist when she moves her fingers.  Physical exam concerning for wrist fracture.  Will obtain left wrist film.  Left wrist x-ray individually evaluated by me.  There is a transverse fracture of the distal radius through the radial styloid.  There is also a fracture of the distal ulna.  Will await radiology read.  Patient reports that she got significant improvement of her pain with the Toradol injection.  Will DC patient home with a sugar tong splint and sling and have her follow-up with orthopedics at another clinic.  Patient can use over-the-counter Tylenol and ibuprofen for pain control.  Final Clinical Impressions(s) / UC Diagnoses   Final diagnoses:  Closed fracture of distal end of left radius, unspecified fracture morphology, initial encounter  Closed fracture of distal end of left ulna, unspecified fracture morphology, initial encounter     Discharge Instructions     You have a fracture of both bones of your left wrist.  Wear the splint and sling until evaluated by orthopedics.  Please call [facility] clinic tomorrow to set up an appointment.  You can use over-the-counter Tylenol and ibuprofen as needed for pain.  Keep your left wrist elevated as much as possible to reduce swelling.  This will help you with pain and also will aid in healing.  Return for increased pain or new or worsening symptoms.    ED Prescriptions    None     PDMP not reviewed this encounter.   Margarette Canada, NP 06/18/20 1614

## 2020-06-18 NOTE — ED Triage Notes (Signed)
Patient complains of left wrist pain that occurred when she fell at home today around 1pm. States that she tripped over something in the floor and fell onto the floor. Patient concerned for a break to her left wrist. Swelling is present.

## 2020-06-18 NOTE — Discharge Instructions (Addendum)
You have a fracture of both bones of your left wrist.  Wear the splint and sling until evaluated by orthopedics.  Please call [facility] clinic tomorrow to set up an appointment.  You can use over-the-counter Tylenol and ibuprofen as needed for pain.  Keep your left wrist elevated as much as possible to reduce swelling.  This will help you with pain and also will aid in healing.  Return for increased pain or new or worsening symptoms.

## 2020-07-16 ENCOUNTER — Other Ambulatory Visit: Payer: Self-pay

## 2020-07-16 ENCOUNTER — Encounter (INDEPENDENT_AMBULATORY_CARE_PROVIDER_SITE_OTHER): Payer: Self-pay | Admitting: Vascular Surgery

## 2020-07-16 ENCOUNTER — Ambulatory Visit (INDEPENDENT_AMBULATORY_CARE_PROVIDER_SITE_OTHER): Payer: Medicare PPO | Admitting: Vascular Surgery

## 2020-07-16 VITALS — BP 144/81 | HR 75 | Resp 16 | Wt 202.0 lb

## 2020-07-16 DIAGNOSIS — I83813 Varicose veins of bilateral lower extremities with pain: Secondary | ICD-10-CM

## 2020-07-16 NOTE — Assessment & Plan Note (Signed)
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  Patient should undergo injection sclerotherapy to treat the residual varicosities.  The risks, benefits and alternative therapies were reviewed in detail with the patient.  All questions were answered.  The patient agrees to proceed with sclerotherapy at their convenience.  The patient will continue wearing the graduated compression stockings and using the over-the-counter pain medications to treat her symptoms.     

## 2020-07-16 NOTE — Progress Notes (Signed)
MRN : [id number]  Jamie Horn is a 79 y.o. (1941-02-04) female who presents with chief complaint of  Chief Complaint  Patient presents with  . Follow-up    Post laser follow up  . Varicose Veins  .  History of Present Illness: Patient returns today in follow up of her venous disease.  She has undergone bilateral laser ablation procedures over the past 2 months.  She does notice improvement in both legs but is bothered by painful residual varicosities in both lower extremities.  The patient reports no periprocedural complications.  Both ablations were successful without complications.  Current Outpatient Medications  Medication Sig Dispense Refill  . doxycycline (MONODOX) 100 MG capsule Take 1 capsule (100 mg total) by mouth daily. Take with food and drink 30 capsule 11  . Vitamin D, Ergocalciferol, (DRISDOL) 50000 units CAPS capsule TAKE 1 CAPSULE (50,000 UNITS TOTAL) BY MOUTH ONCE A WEEK.     No current facility-administered medications for this visit.    Past Medical History:  Diagnosis Date  . Arthritis     Past Surgical History:  Procedure Laterality Date  . CATARACT EXTRACTION W/PHACO Right 10/03/2018   Procedure: CATARACT EXTRACTION PHACO AND INTRAOCULAR LENS PLACEMENT ([place]) RIGHT SYMFONY MULTIFOCAL TORIC;  Surgeon: Eulogio Bear, MD;  Location: [facility];  Service: Ophthalmology;  Laterality: Right;  . CATARACT EXTRACTION W/PHACO Left 12/19/2018   Procedure: CATARACT EXTRACTION PHACO AND INTRAOCULAR LENS PLACEMENT ([place])  LEFT SYMFONY MULTIFOCAL TORIC;  Surgeon: Eulogio Bear, MD;  Location: [facility];  Service: Ophthalmology;  Laterality: Left;  . colonosc    . HERNIA REPAIR    . JOINT REPLACEMENT Bilateral    knees  . KNEE SURGERY     partial knee replacement      Social History   Tobacco Use  . Smoking status: Former Smoker    Quit date: 2006    Years since quitting: 15.9  . Smokeless tobacco: Never Used  Vaping Use  .  Vaping Use: Never used  Substance Use Topics  . Alcohol use: No  . Drug use: No      Family History  Problem Relation Age of Onset  . Breast cancer Sister 38    Allergies  Allergen Reactions  . Tape Other (See Comments)    blisters    REVIEW OF SYSTEMS (Negative unless checked)  Constitutional: [] ?Weight loss  [] ?Fever  [] ?Chills Cardiac: [] ?Chest pain   [] ?Chest pressure   [] ?Palpitations   [] ?Shortness of breath when laying flat   [] ?Shortness of breath at rest   [] ?Shortness of breath with exertion. Vascular:  [x] ?Pain in legs with walking   [x] ?Pain in legs at rest   [] ?Pain in legs when laying flat   [] ?Claudication   [] ?Pain in feet when walking  [] ?Pain in feet at rest  [] ?Pain in feet when laying flat   [] ?History of DVT   [] ?Phlebitis   [x] ?Swelling in legs   [x] ?Varicose veins   [] ?Non-healing ulcers Pulmonary:   [] ?Uses home oxygen   [] ?Productive cough   [] ?Hemoptysis   [] ?Wheeze  [] ?COPD   [] ?Asthma Neurologic:  [] ?Dizziness  [] ?Blackouts   [] ?Seizures   [] ?History of stroke   [] ?History of TIA  [] ?Aphasia   [] ?Temporary blindness   [] ?Dysphagia   [] ?Weakness or numbness in arms   [] ?Weakness or numbness in legs Musculoskeletal:  [x] ?Arthritis   [] ?Joint swelling   [x] ?Joint pain   [] ?Low back pain Hematologic:  [] ?Easy bruising  [] ?Easy bleeding   [] ?  Hypercoagulable state   [] ?Anemic   Gastrointestinal:  [] ?Blood in stool   [] ?Vomiting blood  [] ?Gastroesophageal reflux/heartburn   [] ?Abdominal pain Genitourinary:  [] ?Chronic kidney disease   [] ?Difficult urination  [] ?Frequent urination  [] ?Burning with urination   [] ?Hematuria Skin:  [] ?Rashes   [] ?Ulcers   [] ?Wounds Psychological:  [] ?History of anxiety   [] ? History of major depression.  Physical Examination  BP (!) 144/81 (BP Location: Right Arm)   Pulse 75   Resp 16   Wt 202 lb (91.6 kg)   BMI 34.67 kg/m  Gen:  WD/WN, NAD Head: [place]/AT, No temporalis wasting. Ear/Nose/Throat: Hearing grossly intact, nares  w/o erythema or drainage Eyes: Conjunctiva clear. Sclera non-icteric Neck: Supple.  Trachea midline Pulmonary:  Good air movement, no use of accessory muscles.  Cardiac: RRR, no JVD Vascular:  Vessel Right Left  Radial Palpable Palpable       Musculoskeletal: M/S 5/5 throughout.  No deformity or atrophy.  Prominent varicosities present bilaterally a little worse on the left than the right.  Mild bilateral lower extremity edema. Neurologic: Sensation grossly intact in extremities.  Symmetrical.  Speech is fluent.  Psychiatric: Judgment intact, Mood & affect appropriate for pt's clinical situation. Dermatologic: No rashes or ulcers noted.  No cellulitis or open wounds.       Labs No results found for this or any previous visit (from the past 2160 hour(s)).  Radiology DG Wrist Complete Left  Result Date: 06/18/2020 CLINICAL DATA:  79 year old female with fall and pain and deformity of the left wrist. EXAM: LEFT WRIST - COMPLETE 3+ VIEW COMPARISON:  None. FINDINGS: There is a mildly displaced and angulated fracture of the distal radial metaphysis with mild dorsal angulation of the distal fracture fragment. Longitudinal lucency extending from the transverse component of the fracture may extend to the articular surface. This is however suboptimally evaluated on the provided images. There is mildly displaced fracture of the ulnar styloid. There is no dislocation. The bones are osteopenic. There is mild soft tissue swelling of the wrist. IMPRESSION: Mildly displaced and angulated fracture of the distal radial metaphysis and mildly displaced fracture of the ulnar styloid. Electronically Signed   By: Anner Crete M.D.   On: 06/18/2020 15:54   VAS [place] LOWER EXTREMITY VENOUS POST ABLATION  Result Date: 06/18/2020  Lower Venous Reflux Study Indications: Lt GSV Post Ablation Imaging.  Performing Technologist: Almira Coaster RVS  Examination Guidelines: A complete evaluation includes B-mode  imaging, spectral Doppler, color Doppler, and power Doppler as needed of all accessible portions of each vessel. Bilateral testing is considered an integral part of a complete examination. Limited examinations for reoccurring indications may be performed as noted. The reflux portion of the exam is performed with the patient in reverse Trendelenburg. Significant venous reflux is defined as >500 ms in the superficial venous system, and >1 second in the deep venous system.  +--------------+--------+------+----------+------------+-----------------------+ LEFT          Reflux  Reflux  Reflux  Diameter cmsComments                              No       Yes     Time                                       +--------------+--------+------+----------+------------+-----------------------+ CFV  no                                                          +--------------+--------+------+----------+------------+-----------------------+ FV prox       no                                                          +--------------+--------+------+----------+------------+-----------------------+ FV mid                 yes   2061 ms                                      +--------------+--------+------+----------+------------+-----------------------+ FV dist       no                                                          +--------------+--------+------+----------+------------+-----------------------+ Popliteal     no                                                          +--------------+--------+------+----------+------------+-----------------------+ GSV at SFJ    no                                  prior                                                                     ablation/stripping      +--------------+--------+------+----------+------------+-----------------------+ GSV prox thighno                                  prior                                                                      ablation/stripping      +--------------+--------+------+----------+------------+-----------------------+ GSV mid thigh no                                  prior  ablation/stripping      +--------------+--------+------+----------+------------+-----------------------+ GSV dist thighno                                  prior                                                                     ablation/stripping      +--------------+--------+------+----------+------------+-----------------------+ GSV at knee   no                                  prior                                                                     ablation/stripping      +--------------+--------+------+----------+------------+-----------------------+ GSV prox calf no                                  prior                                                                     ablation/stripping      +--------------+--------+------+----------+------------+-----------------------+ SSV Pop Fossa          yes   4401 ms                                      +--------------+--------+------+----------+------------+-----------------------+   Summary: Left: - No evidence of deep vein thrombosis seen in the left lower extremity, from the common femoral through the popliteal veins.  - No evidence of superficial venous reflux seen in the left greater saphenous vein.  - Venous reflux is noted in the left short saphenous vein. - The Left GSV appears to be occluded via Ablation from the Level of the knee to approximately 3.13cms from the SFJ.  *See table(s) above for measurements and observations. Electronically signed by Leotis Pain MD on 06/18/2020 at 8:26:15 AM.    Final     Assessment/Plan  Varicose veins of leg with pain, bilateral Recommend:  The patient has had successful ablation of the  previously incompetent saphenous venous system but still has persistent symptoms of pain and swelling that are having a negative impact on daily life and daily activities.  Patient should undergo injection sclerotherapy to treat the residual varicosities.  The risks, benefits and alternative therapies were reviewed in detail with the patient.  All questions were answered.  The patient agrees to proceed with sclerotherapy at their convenience.  The patient will continue wearing the graduated compression stockings  and using the over-the-counter pain medications to treat her symptoms.         Leotis Pain, MD  07/16/2020 2:18 PM    This note was created with Dragon medical transcription system.  Any errors from dictation are purely unintentional

## 2020-07-16 NOTE — Patient Instructions (Signed)
Sclerotherapy  Sclerotherapy is a procedure that is done to improve the appearance of varicose veins and spider veins and to help relieve aching, swelling, cramping, and pain in the legs. Varicose veins are veins that have become enlarged, bulging, and twisted due to a damaged valve that causes blood to collect (pool) in the veins. Spider veins are small varicose veins. Sclerotherapy usually works best for smaller spider and varicose veins. This procedure involves injecting a chemical into the vein to close it off. You may need more than one treatment to close a vein all the way. Sclerotherapy is usually performed on the legs because that is where varicose and spider veins most often occur. Tell a health care provider about:  Any allergies you have.  All medicines you are taking, including vitamins, herbs, eye drops, creams, and over-the-counter medicines.  Any blood disorders you have.  Any surgeries you have had.  Any medical conditions you have.  Whether you are pregnant or may be pregnant. What are the risks? Generally, this is a safe procedure. However, problems may occur, including:  Infection.  Bleeding.  Allergic reactions to medicines or dyes.  Blood clots.  Nerve damage.  Bruising and scarring.  Darkened skin around the area. What happens before the procedure?  Do not use lotions or creams on your legs unless your health care provider approves.  Follow instructions from your health care provider about eating and drinking restrictions.  Do not use any products that contain nicotine or tobacco, such as cigarettes and e-cigarettes. If you need help quitting, ask your health care provider.  Ask your health care provider about: ? Changing or stopping your regular medicines. This is especially important if you are taking diabetes medicines or blood thinners. ? Taking medicines such as aspirin and ibuprofen. These medicines can thin your blood. Do not take these  medicines before your procedure if your health care provider instructs you not to.  You may have an ultrasound of the affected area to check for blood clots and to check blood flow.  In rare cases, you may have an X-ray procedure to check how blood flows through your veins (angiogram). For an angiogram, a dye is injected to outline your veins on X-rays. What happens during the procedure?  To lower your risk of infection: ? Your health care team will wash or sanitize their hands. ? Your skin will be washed with soap. ? Hair may be removed from the treatment area.  A small, thin needle will be used to inject a chemical (sclerosant) into your varicose vein. The sclerosant will irritate the lining of the vein and cause the vein to close below the injection site. You may feel some stinging, burning, or irritation.  The injection may be repeated for more than one varicose vein.  The injection area will be wrapped with elastic bandages. The procedure may vary among health care providers and hospitals. What happens after the procedure?  Your injection area will be wrapped with elastic bandages. If there is bleeding, the bandages may be changed.  Do not drive until your health care provider approves. You may need to wait 1-2 days before driving.  You will need to wear compression stockings for about a week, or as long as your health care provider recommends. Summary  Sclerotherapy is a procedure that is done to improve the appearance of varicose veins and spider veins and to help relieve aching, swelling, cramping, and pain in the legs.  A small, thin needle is   used to inject a chemical (sclerosant) into a spider vein or varicose vein to close it off.  Elastic bandages will be wrapped around the injection area after the procedure. This information is not intended to replace advice given to you by your health care provider. Make sure you discuss any questions you have with your health care  provider. Document Revised: 11/11/2018 Document Reviewed: 09/08/2016 Elsevier Patient Education  2020 [employer]  

## 2020-09-04 ENCOUNTER — Ambulatory Visit (INDEPENDENT_AMBULATORY_CARE_PROVIDER_SITE_OTHER): Payer: Medicare PPO | Admitting: Vascular Surgery

## 2020-09-13 DIAGNOSIS — M7541 Impingement syndrome of right shoulder: Secondary | ICD-10-CM | POA: Insufficient documentation

## 2020-10-02 ENCOUNTER — Ambulatory Visit (INDEPENDENT_AMBULATORY_CARE_PROVIDER_SITE_OTHER): Payer: Medicare PPO | Admitting: Vascular Surgery

## 2020-11-06 ENCOUNTER — Other Ambulatory Visit: Payer: Self-pay

## 2020-11-06 ENCOUNTER — Encounter (INDEPENDENT_AMBULATORY_CARE_PROVIDER_SITE_OTHER): Payer: Self-pay | Admitting: Vascular Surgery

## 2020-11-06 ENCOUNTER — Ambulatory Visit (INDEPENDENT_AMBULATORY_CARE_PROVIDER_SITE_OTHER): Payer: Medicare PPO | Admitting: Vascular Surgery

## 2020-11-06 VITALS — BP 141/81 | HR 73 | Ht 63.0 in | Wt 200.0 lb

## 2020-11-06 DIAGNOSIS — I83813 Varicose veins of bilateral lower extremities with pain: Secondary | ICD-10-CM

## 2020-11-06 NOTE — Progress Notes (Signed)
Varicose veins of bilateral lower extremity with inflammation (454.1  I83.10) Current Plans   Indication: Patient presents with symptomatic varicose veins of the bilateral lower extremity.   Procedure: Sclerotherapy using hypertonic saline mixed with 1% Lidocaine was performed on the bilateral lower extremity. Compression wraps were placed. The patient tolerated the procedure well.  Today was the patient's last sclerotherapy appointment.  Even though she has seen good results, there are still large varicosities noted to the bilateral lower extremity which cause discomfort to the patient which interferes with her ability to function on a daily basis.  The patient would benefit for continued sclerotherapy.

## 2020-12-04 ENCOUNTER — Encounter (INDEPENDENT_AMBULATORY_CARE_PROVIDER_SITE_OTHER): Payer: Self-pay | Admitting: Vascular Surgery

## 2020-12-04 ENCOUNTER — Other Ambulatory Visit: Payer: Self-pay

## 2020-12-04 ENCOUNTER — Ambulatory Visit (INDEPENDENT_AMBULATORY_CARE_PROVIDER_SITE_OTHER): Payer: Medicare PPO | Admitting: Vascular Surgery

## 2020-12-04 VITALS — BP 151/83 | HR 83 | Resp 16 | Wt 201.6 lb

## 2020-12-04 DIAGNOSIS — I83813 Varicose veins of bilateral lower extremities with pain: Secondary | ICD-10-CM | POA: Diagnosis not present

## 2020-12-04 NOTE — Progress Notes (Signed)
Varicose veins of bilateral  lower extremity with inflammation (454.1  I83.10) Current Plans   Indication: Patient presents with symptomatic varicose veins of the bilateral  lower extremity.   Procedure: Sclerotherapy using hypertonic saline mixed with 1% Lidocaine was performed on the bilateral lower extremity. Compression wraps were placed. The patient tolerated the procedure well. 

## 2020-12-27 ENCOUNTER — Other Ambulatory Visit: Payer: Self-pay | Admitting: Internal Medicine

## 2020-12-27 DIAGNOSIS — Z1231 Encounter for screening mammogram for malignant neoplasm of breast: Secondary | ICD-10-CM

## 2021-01-01 ENCOUNTER — Ambulatory Visit (INDEPENDENT_AMBULATORY_CARE_PROVIDER_SITE_OTHER): Payer: Medicare PPO | Admitting: Vascular Surgery

## 2021-01-22 ENCOUNTER — Ambulatory Visit (INDEPENDENT_AMBULATORY_CARE_PROVIDER_SITE_OTHER): Payer: Medicare PPO | Admitting: Vascular Surgery

## 2021-01-22 ENCOUNTER — Ambulatory Visit
Admission: RE | Admit: 2021-01-22 | Discharge: 2021-01-22 | Disposition: A | Discharge status: Home / Self Care | Payer: Medicare PPO | Source: Ambulatory Visit | Attending: Internal Medicine | Admitting: Internal Medicine

## 2021-01-22 ENCOUNTER — Other Ambulatory Visit: Payer: Self-pay

## 2021-01-22 ENCOUNTER — Encounter (INDEPENDENT_AMBULATORY_CARE_PROVIDER_SITE_OTHER): Payer: Self-pay | Admitting: Vascular Surgery

## 2021-01-22 VITALS — BP 128/77 | HR 76 | Ht 63.0 in | Wt 207.0 lb

## 2021-01-22 DIAGNOSIS — I83813 Varicose veins of bilateral lower extremities with pain: Secondary | ICD-10-CM

## 2021-01-22 DIAGNOSIS — Z1231 Encounter for screening mammogram for malignant neoplasm of breast: Secondary | ICD-10-CM | POA: Diagnosis present

## 2021-01-22 NOTE — Progress Notes (Signed)
Varicose veins of bilateral  lower extremity with inflammation (454.1  I83.10) Current Plans   Indication: Patient presents with symptomatic varicose veins of the bilateral  lower extremity.   Procedure: Sclerotherapy using hypertonic saline mixed with 1% Lidocaine was performed on the bilateral lower extremity. Compression wraps were placed. The patient tolerated the procedure well. 

## 2021-03-03 DEATH — deceased

## 2021-03-13 ENCOUNTER — Encounter: Payer: Medicare PPO | Admitting: Dermatology

## 2021-04-16 ENCOUNTER — Encounter: Payer: Medicare PPO | Admitting: Dermatology
